# Patient Record
Sex: Male | Born: 1970 | Race: Black or African American | Hispanic: No | Marital: Single | Smoking: Current every day smoker
Health system: Southern US, Community
[De-identification: ages and names within clinical notes are randomized; demographics above are authoritative.]

## PROBLEM LIST (undated history)

## (undated) DIAGNOSIS — M549 Dorsalgia, unspecified: Secondary | ICD-10-CM

## (undated) DIAGNOSIS — K219 Gastro-esophageal reflux disease without esophagitis: Secondary | ICD-10-CM

## (undated) DIAGNOSIS — F32A Depression, unspecified: Secondary | ICD-10-CM

## (undated) DIAGNOSIS — F1121 Opioid dependence, in remission: Secondary | ICD-10-CM

## (undated) DIAGNOSIS — F1120 Opioid dependence, uncomplicated: Secondary | ICD-10-CM

## (undated) DIAGNOSIS — A4902 Methicillin resistant Staphylococcus aureus infection, unspecified site: Secondary | ICD-10-CM

## (undated) DIAGNOSIS — F329 Major depressive disorder, single episode, unspecified: Secondary | ICD-10-CM

## (undated) DIAGNOSIS — T7840XA Allergy, unspecified, initial encounter: Secondary | ICD-10-CM

## (undated) HISTORY — DX: Allergy, unspecified, initial encounter: T78.40XA

## (undated) HISTORY — DX: Opioid dependence, in remission: F11.21

## (undated) HISTORY — PX: KNEE ARTHROSCOPY: SUR90

## (undated) HISTORY — DX: Opioid dependence, uncomplicated: F11.20

---

## 2014-06-08 ENCOUNTER — Emergency Department (HOSPITAL_COMMUNITY): Payer: 59

## 2014-06-08 ENCOUNTER — Encounter (HOSPITAL_COMMUNITY): Payer: Self-pay | Admitting: Emergency Medicine

## 2014-06-08 ENCOUNTER — Emergency Department (HOSPITAL_COMMUNITY)
Admission: EM | Admit: 2014-06-08 | Discharge: 2014-06-09 | Disposition: A | Discharge status: Home / Self Care | Payer: 59 | Attending: Emergency Medicine | Admitting: Emergency Medicine

## 2014-06-08 DIAGNOSIS — Z79899 Other long term (current) drug therapy: Secondary | ICD-10-CM | POA: Diagnosis not present

## 2014-06-08 DIAGNOSIS — Z87828 Personal history of other (healed) physical injury and trauma: Secondary | ICD-10-CM | POA: Diagnosis not present

## 2014-06-08 DIAGNOSIS — Z720 Tobacco use: Secondary | ICD-10-CM | POA: Insufficient documentation

## 2014-06-08 DIAGNOSIS — Z8614 Personal history of Methicillin resistant Staphylococcus aureus infection: Secondary | ICD-10-CM | POA: Insufficient documentation

## 2014-06-08 DIAGNOSIS — R509 Fever, unspecified: Secondary | ICD-10-CM | POA: Diagnosis present

## 2014-06-08 DIAGNOSIS — R059 Cough, unspecified: Secondary | ICD-10-CM

## 2014-06-08 DIAGNOSIS — Z880 Allergy status to penicillin: Secondary | ICD-10-CM | POA: Insufficient documentation

## 2014-06-08 DIAGNOSIS — J180 Bronchopneumonia, unspecified organism: Secondary | ICD-10-CM | POA: Diagnosis not present

## 2014-06-08 DIAGNOSIS — R05 Cough: Secondary | ICD-10-CM

## 2014-06-08 HISTORY — DX: Methicillin resistant Staphylococcus aureus infection, unspecified site: A49.02

## 2014-06-08 HISTORY — DX: Dorsalgia, unspecified: M54.9

## 2014-06-08 LAB — CBC WITH DIFFERENTIAL/PLATELET
BASOS ABS: 0 10*3/uL (ref 0.0–0.1)
BASOS PCT: 0 % (ref 0–1)
EOS ABS: 0 10*3/uL (ref 0.0–0.7)
Eosinophils Relative: 0 % (ref 0–5)
HCT: 37 % — ABNORMAL LOW (ref 39.0–52.0)
HEMOGLOBIN: 12.3 g/dL — AB (ref 13.0–17.0)
LYMPHS PCT: 3 % — AB (ref 12–46)
Lymphs Abs: 0.4 10*3/uL — ABNORMAL LOW (ref 0.7–4.0)
MCH: 28.7 pg (ref 26.0–34.0)
MCHC: 33.2 g/dL (ref 30.0–36.0)
MCV: 86.4 fL (ref 78.0–100.0)
MONO ABS: 0.9 10*3/uL (ref 0.1–1.0)
MONOS PCT: 7 % (ref 3–12)
Neutro Abs: 11.8 10*3/uL — ABNORMAL HIGH (ref 1.7–7.7)
Neutrophils Relative %: 90 % — ABNORMAL HIGH (ref 43–77)
Platelets: 262 10*3/uL (ref 150–400)
RBC: 4.28 MIL/uL (ref 4.22–5.81)
RDW: 14.5 % (ref 11.5–15.5)
WBC: 13.2 10*3/uL — ABNORMAL HIGH (ref 4.0–10.5)

## 2014-06-08 LAB — COMPREHENSIVE METABOLIC PANEL
ALBUMIN: 4 g/dL (ref 3.5–5.2)
ALK PHOS: 47 U/L (ref 39–117)
ALT: 16 U/L (ref 0–53)
ANION GAP: 5 (ref 5–15)
AST: 18 U/L (ref 0–37)
BUN: 18 mg/dL (ref 6–23)
CO2: 25 mmol/L (ref 19–32)
Calcium: 8.6 mg/dL (ref 8.4–10.5)
Chloride: 106 mmol/L (ref 96–112)
Creatinine, Ser: 0.95 mg/dL (ref 0.50–1.35)
GFR calc non Af Amer: >90 mL/min (ref >90)
Glucose, Bld: 116 mg/dL — ABNORMAL HIGH (ref 70–99)
POTASSIUM: 2.8 mmol/L — AB (ref 3.5–5.1)
SODIUM: 136 mmol/L (ref 135–145)
Total Bilirubin: 0.6 mg/dL (ref 0.3–1.2)
Total Protein: 6.7 g/dL (ref 6.0–8.3)

## 2014-06-08 MED ORDER — SODIUM CHLORIDE 0.9 % IV BOLUS (SEPSIS)
1000.0000 mL | Freq: Once | INTRAVENOUS | Status: AC
  Administered 2014-06-08: 1000 mL via INTRAVENOUS

## 2014-06-08 MED ORDER — KETOROLAC TROMETHAMINE 30 MG/ML IJ SOLN
30.0000 mg | Freq: Once | INTRAMUSCULAR | Status: AC
  Administered 2014-06-08: 30 mg via INTRAVENOUS
  Filled 2014-06-08: qty 1

## 2014-06-08 NOTE — ED Notes (Addendum)
Patient states that he has had a cough that got worse today. Took temp tonight and it was 102 at home. Given nyquil. Alert and oriented. Also c/o headache and nausea

## 2014-06-08 NOTE — ED Provider Notes (Signed)
CSN: [id number]     Arrival date & time 06/08/14  2214 History   First MD Initiated Contact with Patient 06/08/14 2228     Chief Complaint  Patient presents with  . Fever  . URI   HPI  Patient is a 44 year old male with past medical history of back pain who presents emergency room for evaluation of fever and upper respiratory symptoms. Patient states that this morning he woke up and was feeling very hot. He developed a dry hacking cough with runny nose and congestion. He denies sore throat. He then developed nausea and vomiting. He took NyQuil at home but then proceeded to vomit. He is vomiting 3 times today. Patient reports that his brother had similar symptoms yesterday. Patient has not had a flu shot this year.  Past Medical History  Diagnosis Date  . Back pain   . MRSA (methicillin resistant Staphylococcus aureus)   . Back injury    History reviewed. No pertinent past surgical history. History reviewed. No pertinent family history. History  Substance Use Topics  . Smoking status: Current Every Day Smoker -- 1.00 packs/day  . Smokeless tobacco: Not on file  . Alcohol Use: Yes     Comment: fifth a week    Review of Systems  Constitutional: Positive for fever, chills and fatigue.  HENT: Positive for congestion and rhinorrhea. Negative for sinus pressure, sore throat and voice change.   Respiratory: Positive for cough, chest tightness and shortness of breath. Negative for wheezing.   Cardiovascular: Negative for chest pain, palpitations and leg swelling.  Gastrointestinal: Positive for nausea and vomiting. Negative for abdominal pain, diarrhea, constipation and blood in stool.  Genitourinary: Negative for dysuria, urgency, frequency, hematuria and difficulty urinating.  Neurological: Positive for headaches.      Allergies  Penicillins  Home Medications   Prior to Admission medications   Medication Sig Start Date End Date Taking? Authorizing Provider  Ascorbic Acid  (VITAMIN C ER PO) Take 1 tablet by mouth daily.   Yes Historical Provider, MD  azithromycin (ZITHROMAX) 250 MG tablet Take 1 tablet every day until finished. 06/09/14   Rashan Patient A Forcucci, PA-C  benzonatate (TESSALON) 100 MG capsule Take 1 capsule (100 mg total) by mouth every 8 (eight) hours. 06/09/14   Morrell Fluke A Forcucci, PA-C  calcium carbonate (TUMS - DOSED IN MG ELEMENTAL CALCIUM) 500 MG chewable tablet Chew 2 tablets by mouth 3 (three) times daily as needed for indigestion or heartburn.   Yes Historical Provider, MD  Cetirizine HCl (ZYRTEC ALLERGY) 10 MG CAPS Take 1 capsule (10 mg total) by mouth at bedtime. 06/09/14   Zarie Kosiba A Forcucci, PA-C  Cyanocobalamin (VITAMIN B-12 CR PO) Take 1 tablet by mouth daily.   Yes Historical Provider, MD  famotidine (PEPCID) 10 MG tablet Take 30 mg by mouth 2 (two) times daily as needed for heartburn or indigestion.   Yes Historical Provider, MD  Multiple Vitamin (MULTIVITAMIN WITH MINERALS) TABS tablet Take 1 tablet by mouth daily.   Yes Historical Provider, MD  omeprazole (PRILOSEC) 20 MG capsule Take 20 mg by mouth daily as needed (heart burn).   Yes Historical Provider, MD  ondansetron (ZOFRAN) 4 MG tablet Take 1 tablet (4 mg total) by mouth every 6 (six) hours. 06/09/14   Andrik Sandt A Forcucci, PA-C  POTASSIUM GLUCONATE PO Take 1 tablet by mouth daily.   Yes Historical Provider, MD  Pseudoeph-Doxylamine-DM-APAP 30-6.25-15-325 MG CAPS Take 2 capsules by mouth at bedtime as needed (cough).   Yes  Historical Provider, MD  Pyridoxine HCl (VITAMIN B-6 PO) Take 1 tablet by mouth daily.   Yes Historical Provider, MD  ranitidine (ZANTAC) 75 MG tablet Take 75 mg by mouth 2 (two) times daily as needed for heartburn.   Yes Historical Provider, MD  VITAMIN D, CHOLECALCIFEROL, PO Take 1 capsule by mouth daily.   Yes Historical Provider, MD   BP 138/76 mmHg  Pulse 94  Temp(Src) 98.3 F (36.8 C) (Oral)  Resp 20  Ht  (1.88 m)  Wt 230 lb (104.327 kg)  BMI 29.52  kg/m2  SpO2 94% Physical Exam  Constitutional: He is oriented to person, place, and time. He appears well-developed and well-nourished. No distress.  HENT:  Head: Normocephalic and atraumatic.  Mouth/Throat: Oropharynx is clear and moist. No oropharyngeal exudate.  Eyes: Conjunctivae and EOM are normal. Pupils are equal, round, and reactive to light. No scleral icterus.  Neck: Normal range of motion. Neck supple. No JVD present. No thyromegaly present.  Cardiovascular: Normal rate, regular rhythm, normal heart sounds and intact distal pulses.  Exam reveals no gallop and no friction rub.   No murmur heard. Pulmonary/Chest: Effort normal. No tachypnea. No respiratory distress. He has no decreased breath sounds. He has no wheezes. He has rhonchi in the right lower field and the left lower field. He has no rales. He exhibits no tenderness.  Abdominal: Soft. Bowel sounds are normal. He exhibits no distension and no mass. There is no tenderness. There is no rebound and no guarding.  Musculoskeletal: Normal range of motion.  Lymphadenopathy:    He has no cervical adenopathy.  Neurological: He is alert and oriented to person, place, and time.  Skin: Skin is warm and dry. He is not diaphoretic.  Psychiatric: He has a normal mood and affect. His behavior is normal. Judgment and thought content normal.  Nursing note and vitals reviewed.   ED Course  Procedures (including critical care time) Labs Review Labs Reviewed  CBC WITH DIFFERENTIAL/PLATELET - Abnormal; Notable for the following:    WBC 13.2 (*)    Hemoglobin 12.3 (*)    HCT 37.0 (*)    Neutrophils Relative % 90 (*)    Neutro Abs 11.8 (*)    Lymphocytes Relative 3 (*)    Lymphs Abs 0.4 (*)    All other components within normal limits  COMPREHENSIVE METABOLIC PANEL - Abnormal; Notable for the following:    Potassium 2.8 (*)    Glucose, Bld 116 (*)    All other components within normal limits    Imaging Review Dg Chest 2  View  06/08/2014   CLINICAL DATA:  Cough.  EXAM: CHEST  2 VIEW  COMPARISON:  None.  FINDINGS: There is diffuse interstitial coarsening from bronchial wall thickening with subtle asymmetric density at the medial right base. Normal heart size and aortic contours. Symmetric overgrowth of the first costochondral junction, without convincing superimposed nodule.  IMPRESSION: Bronchitis with possible bronchopneumonia in the right lower lobe. Recommend chest x-ray follow-up 6 weeks after treatment.   Electronically Signed   By: Marnee Spring M.D.   On: 06/08/2014 23:51     EKG Interpretation None      MDM   Final diagnoses:  Cough  Acute bronchopneumonia   Patient is a 44 year old male who presents emergency room for evaluation of fever, cough, and shortness of breath. On physical exam patient is ill-appearing but nontoxic. Patient was afebrile here until midway through his visit. Patient given Tylenol and Toradol here.  Patient also given 1 L normal saline bolus as he appeared to be mildly dehydrated. CMP reveals hypokalemia. CBC reveals mild leukocytosis. Chest x-ray reveals acute bronchitis versus bronchopneumonia. Patient is able to ambulate with an average pulse ox greater than 92%. We'll discharge home with azithromycin. Dose given here. We'll also give albuterol, Tessalon, Zyrtec, nasal saline, and Zofran. Patient given potassium here. Patient stable for discharge at this time. Patient states understanding and agreement with the above plan. Patient return for worsening shortness of breath, intractable fevers, chest pain, or any other concerning symptoms. He states understanding and agreement at this time. Resource list given. We'll also give content community health and wellness referral. Patient seen by and discussed with Dr. Adriana Simas who agrees the above workup and plan.    Eben Burow, PA-C 06/09/14 4401  Donnetta Hutching, MD 06/11/14 [phone]

## 2014-06-09 MED ORDER — ONDANSETRON HCL 4 MG PO TABS: 4.0000 mg | ORAL_TABLET | Freq: Four times a day (QID) | ORAL | Status: DC

## 2014-06-09 MED ORDER — ACETAMINOPHEN 325 MG PO TABS
650.0000 mg | ORAL_TABLET | Freq: Once | ORAL | Status: AC
  Administered 2014-06-09: 650 mg via ORAL
  Filled 2014-06-09: qty 2

## 2014-06-09 MED ORDER — CETIRIZINE HCL 10 MG PO CAPS: 10.0000 mg | ORAL_CAPSULE | Freq: Every day | ORAL | Status: DC

## 2014-06-09 MED ORDER — BENZONATATE 100 MG PO CAPS: 100.0000 mg | ORAL_CAPSULE | Freq: Three times a day (TID) | ORAL | Status: DC

## 2014-06-09 MED ORDER — POTASSIUM CHLORIDE CRYS ER 20 MEQ PO TBCR
40.0000 meq | EXTENDED_RELEASE_TABLET | Freq: Once | ORAL | Status: AC
  Administered 2014-06-09: 40 meq via ORAL
  Filled 2014-06-09: qty 2

## 2014-06-09 MED ORDER — AZITHROMYCIN 250 MG PO TABS: ORAL_TABLET | ORAL | Status: DC

## 2014-06-09 MED ORDER — AZITHROMYCIN 250 MG PO TABS
500.0000 mg | ORAL_TABLET | Freq: Once | ORAL | Status: AC
Start: 2014-06-09 — End: 2014-06-09
  Administered 2014-06-09: 500 mg via ORAL
  Filled 2014-06-09: qty 2

## 2014-06-09 MED ORDER — ALBUTEROL SULFATE HFA 108 (90 BASE) MCG/ACT IN AERS
2.0000 | INHALATION_SPRAY | Freq: Once | RESPIRATORY_TRACT | Status: AC
  Administered 2014-06-09: 2 via RESPIRATORY_TRACT
  Filled 2014-06-09: qty 13.4

## 2014-06-09 NOTE — ED Notes (Signed)
Stayed above 92% while ambulating

## 2014-06-09 NOTE — Discharge Instructions (Signed)

## 2014-08-04 ENCOUNTER — Encounter (HOSPITAL_COMMUNITY): Payer: Self-pay | Admitting: Emergency Medicine

## 2014-08-04 DIAGNOSIS — Z880 Allergy status to penicillin: Secondary | ICD-10-CM | POA: Insufficient documentation

## 2014-08-04 DIAGNOSIS — Z87828 Personal history of other (healed) physical injury and trauma: Secondary | ICD-10-CM | POA: Insufficient documentation

## 2014-08-04 DIAGNOSIS — Z8614 Personal history of Methicillin resistant Staphylococcus aureus infection: Secondary | ICD-10-CM | POA: Insufficient documentation

## 2014-08-04 DIAGNOSIS — E119 Type 2 diabetes mellitus without complications: Secondary | ICD-10-CM | POA: Diagnosis not present

## 2014-08-04 DIAGNOSIS — F1310 Sedative, hypnotic or anxiolytic abuse, uncomplicated: Secondary | ICD-10-CM | POA: Insufficient documentation

## 2014-08-04 DIAGNOSIS — Z79899 Other long term (current) drug therapy: Secondary | ICD-10-CM | POA: Diagnosis not present

## 2014-08-04 DIAGNOSIS — K219 Gastro-esophageal reflux disease without esophagitis: Secondary | ICD-10-CM | POA: Diagnosis not present

## 2014-08-04 DIAGNOSIS — F1110 Opioid abuse, uncomplicated: Secondary | ICD-10-CM | POA: Diagnosis not present

## 2014-08-04 DIAGNOSIS — R45851 Suicidal ideations: Secondary | ICD-10-CM | POA: Diagnosis present

## 2014-08-04 DIAGNOSIS — Z720 Tobacco use: Secondary | ICD-10-CM | POA: Insufficient documentation

## 2014-08-05 ENCOUNTER — Emergency Department (HOSPITAL_COMMUNITY)
Admission: EM | Admit: 2014-08-05 | Discharge: 2014-08-05 | Disposition: A | Discharge status: Home / Self Care | Payer: 59 | Attending: Emergency Medicine | Admitting: Emergency Medicine

## 2014-08-05 DIAGNOSIS — F1110 Opioid abuse, uncomplicated: Secondary | ICD-10-CM

## 2014-08-05 HISTORY — DX: Gastro-esophageal reflux disease without esophagitis: K21.9

## 2014-08-05 HISTORY — DX: Major depressive disorder, single episode, unspecified: F32.9

## 2014-08-05 HISTORY — DX: Depression, unspecified: F32.A

## 2014-08-05 LAB — COMPREHENSIVE METABOLIC PANEL
ALBUMIN: 4 g/dL (ref 3.5–5.2)
ALT: 22 U/L (ref 0–53)
AST: 46 U/L — ABNORMAL HIGH (ref 0–37)
Alkaline Phosphatase: 54 U/L (ref 39–117)
Anion gap: 8 (ref 5–15)
BUN: 11 mg/dL (ref 6–23)
CO2: 27 mmol/L (ref 19–32)
CREATININE: 1.25 mg/dL (ref 0.50–1.35)
Calcium: 9.4 mg/dL (ref 8.4–10.5)
Chloride: 103 mmol/L (ref 96–112)
GFR calc Af Amer: 79 mL/min — ABNORMAL LOW (ref >90)
GFR, EST NON AFRICAN AMERICAN: 69 mL/min — AB (ref >90)
GLUCOSE: 106 mg/dL — AB (ref 70–99)
Potassium: 3.3 mmol/L — ABNORMAL LOW (ref 3.5–5.1)
Sodium: 138 mmol/L (ref 135–145)
Total Bilirubin: 0.7 mg/dL (ref 0.3–1.2)
Total Protein: 6.9 g/dL (ref 6.0–8.3)

## 2014-08-05 LAB — CBC WITH DIFFERENTIAL/PLATELET
BASOS PCT: 0 % (ref 0–1)
Basophils Absolute: 0 10*3/uL (ref 0.0–0.1)
EOS ABS: 0.3 10*3/uL (ref 0.0–0.7)
EOS PCT: 3 % (ref 0–5)
HEMATOCRIT: 36.5 % — AB (ref 39.0–52.0)
Hemoglobin: 12.5 g/dL — ABNORMAL LOW (ref 13.0–17.0)
LYMPHS ABS: 2.8 10*3/uL (ref 0.7–4.0)
LYMPHS PCT: 32 % (ref 12–46)
MCH: 29.3 pg (ref 26.0–34.0)
MCHC: 34.2 g/dL (ref 30.0–36.0)
MCV: 85.5 fL (ref 78.0–100.0)
Monocytes Absolute: 0.6 10*3/uL (ref 0.1–1.0)
Monocytes Relative: 7 % (ref 3–12)
NEUTROS PCT: 58 % (ref 43–77)
Neutro Abs: 4.9 10*3/uL (ref 1.7–7.7)
Platelets: 315 10*3/uL (ref 150–400)
RBC: 4.27 MIL/uL (ref 4.22–5.81)
RDW: 14.8 % (ref 11.5–15.5)
WBC: 8.6 10*3/uL (ref 4.0–10.5)

## 2014-08-05 LAB — RAPID URINE DRUG SCREEN, HOSP PERFORMED
Amphetamines: NOT DETECTED
BARBITURATES: NOT DETECTED
BENZODIAZEPINES: POSITIVE — AB
COCAINE: NOT DETECTED
Opiates: POSITIVE — AB
TETRAHYDROCANNABINOL: NOT DETECTED

## 2014-08-05 LAB — ETHANOL

## 2014-08-05 MED ORDER — ONDANSETRON 8 MG PO TBDP: 8.0000 mg | ORAL_TABLET | Freq: Three times a day (TID) | ORAL | Status: DC | PRN

## 2014-08-05 MED ORDER — ZOLPIDEM TARTRATE 5 MG PO TABS: 5.0000 mg | ORAL_TABLET | Freq: Every evening | ORAL | Status: DC | PRN

## 2014-08-05 MED ORDER — CLONIDINE HCL 0.1 MG PO TABS: 0.1000 mg | ORAL_TABLET | Freq: Four times a day (QID) | ORAL | Status: DC | PRN

## 2014-08-05 NOTE — ED Notes (Signed)
Security wanded pt. at triage . Sitter arrived , sitting with pt. in triage rm. 4.

## 2014-08-05 NOTE — ED Notes (Signed)
Staffing office notified for pt.'s sitter . 

## 2014-08-05 NOTE — ED Notes (Signed)
Pt. reports feeling depressed with suicidal ideation but did not disclose plan of suicide , also requesting detox for Heroin addiction , last Heroin today denies hallucinations .

## 2014-08-05 NOTE — Discharge Instructions (Signed)
Opioid Withdrawal °Opioids are a group of narcotic drugs. They include the street drug heroin. They also include pain medicines, such as morphine, hydrocodone, oxycodone, and fentanyl. Opioid withdrawal is a group of characteristic physical and mental signs and symptoms. It typically occurs if you have been using opioids daily for several weeks or longer and stop using or rapidly decrease use. Opioid withdrawal can also occur if you have used opioids daily for a long time and are given a medicine to block the effect.  °SIGNS AND SYMPTOMS °Opioid withdrawal includes three or more of the following symptoms:  °· Depressed, anxious, or irritable mood. °· Nausea or vomiting. °· Muscle aches or spasms.   °· Watery eyes.    °· Runny nose. °· Dilated pupils, sweating, or hairs standing on end. °· Diarrhea or intestinal cramping. °· Yawning.   °· Fever. °· Increased blood pressure. °· Fast pulse. °· Restlessness or trouble sleeping. °These signs and symptoms occur within several hours of stopping or reducing short-acting opioids, such as heroin. They can occur within 3 days of stopping or reducing long-acting opioids, such as methadone. Withdrawal begins within minutes of receiving a drug that blocks the effects of opioids, such as naltrexone or naloxone. °DIAGNOSIS  °Opioid use disorder is diagnosed by your health care provider. You will be asked about your symptoms, drug and alcohol use, medical history, and use of medicines. A physical exam may be done. Lab tests may be ordered. Your health care provider may have you see a mental health professional.  °TREATMENT  °The treatment for opioid withdrawal is usually provided by medical doctors with special training in substance use disorders (addiction specialists). The following medicines may be included in treatment: °· Opioids given in place of the abused opioid. They turn on opioid receptors in the brain and lessen or prevent withdrawal symptoms. They are gradually  decreased (opioid substitution and taper). °· Non-opioids that can lessen certain opioid withdrawal symptoms. They may be used alone or with opioid substitution and taper. °Successful long-term recovery usually requires medicine, counseling, and group support. °HOME CARE INSTRUCTIONS  °· Take medicines only as directed by your health care provider. °· Check with your health care provider before starting new medicines. °· Keep all follow-up visits as directed by your health care provider. °SEEK MEDICAL CARE IF: °· You are not able to take your medicines as directed. °· Your symptoms get worse. °· You relapse. °SEEK IMMEDIATE MEDICAL CARE IF: °· You have serious thoughts about hurting yourself or others. °· You have a seizure. °· You lose consciousness. °Document Released: 03/31/2003 Document Revised: 08/12/2013 Document Reviewed: 04/10/2013 °ExitCare® Patient Information ©2015 [employer]. This information is not intended to replace advice given to you by your health care provider. Make sure you discuss any questions you have with your health care provider. ° °Emergency Department Resource Guide °1) Find a Doctor and Pay Out of Pocket °Although you won't have to find out who is covered by your insurance plan, it is a good idea to ask around and get recommendations. You will then need to call the office and see if the doctor you have chosen will accept you as a new patient and what types of options they offer for patients who are self-pay. Some doctors offer discounts or will set up payment plans for their patients who do not have insurance, but you will need to ask so you aren't surprised when you get to your appointment. ° °2) Contact Your Local Health Department °Not all health departments have   doctors that can see patients for sick visits, but many do, so it is worth a call to see if yours does. If you don't know where your local health department is, you can check in your phone book. The CDC also has a tool to  help you locate your state's health department, and many state websites also have listings of all of their local health departments. ° °3) Find a Walk-in Clinic °If your illness is not likely to be very severe or complicated, you may want to try a walk in clinic. These are popping up all over the country in pharmacies, drugstores, and shopping centers. They're usually staffed by nurse practitioners or physician assistants that have been trained to treat common illnesses and complaints. They're usually fairly quick and inexpensive. However, if you have serious medical issues or chronic medical problems, these are probably not your best option. ° °No Primary Care Doctor: °- Call [facility] at  832-8000 - they can help you locate a primary care doctor that  accepts your insurance, provides certain services, etc. °- Physician Referral Service- 1-800-533-3463 ° °Chronic Pain Problems: °Organization         Address  Phone   Notes  °[facility]  (336) 297-2271 Patients need to be referred by their primary care doctor.  ° °Medication Assistance: °Organization         Address  Phone   Notes  °[place] [facility] [address] °[place], [place] [address] (336) 641-8030 --Must be a resident of [place] °-- Must have NO insurance coverage whatsoever (no Medicaid/ Medicare, etc.) °-- The pt. MUST have a primary care doctor that directs their care regularly and follows them in the community °  °[facility]  (866) 331-1348   °[employer]  (888) 892-1162   ° °Agencies that provide inexpensive medical care: °Organization         Address  Phone   Notes  °[facility]  (336) 832-8035   °[facility]    (336) 832-7272   °[facility] [address] °[place], [place] [address] (336) 832-4777   °[facility] [address], °[place] (336) 271-4999   °[facility]    (336) 373-0678   °[facility]    (336) 272-1050   °[facility] ° [address], [place] Phone:  (336) 832-4444, Fax:  (336) 832-4440 Hours of Operation:  9 am - 6 pm, M-F.  Also accepts Medicaid/Medicare and self-pay.  °[facility] ° [address], [place] Phone: (336) 832-3150, Fax: (336) 832-3151. Hours of Operation:  8:30 am - 5:30 pm, M-F.  Also accepts Medicaid and self-pay.  °[facility] [place] [address], [place] Phone: (336) 878-6027   °[facility] [address], [place], [place] (336)723-1848, Ext. 123 Mondays & Thursdays: 7-9 AM.  First 15 patients are seen on a first come, first serve basis. °  ° °Medicaid-accepting [place] Providers: ° °Organization         Address  Phone   Notes  °[facility] [address], [place] (336) 641-2100 Also accepts self-pay patients.  °[facility] [address], [place] ° (336) 856-9996   °[facility] [address], [place] (336) 288-8857   °[facility] [address]   [place] [address], [place] (336) 299-7000   °Veita Bland [address], [place]  ° (336) 373-1557 Only accepts [place] Access Medicaid patients after they have their name applied to their card.  ° °Self-Pay (no insurance) in [place]: ° °Organization         Address  Phone   Notes  °Sickle Cell Patients, [facility] [address], [place] (336) 832-1970   °[facility] [address], [place] (336) 832-4400   °[facility] [place] ° [address], [place] (336) 992-4800   °[facility]/Dr. Osei-Bonsu ° [address] [place] [address], [place] or [address], [place] (336) [phone] Phone number for both [place] and [place] locations is the same.  °[facility] [address],  [place] (336) 299-0000   °[facility] [address] [place] [address], [place] or [address] (336) 852-7530 °(336) 878-2260   °[facility] [address], [place] (336) 350-1642, phone; (336) 294-5005, fax Sees patients 1st and 3rd Saturday of every month.  Must not qualify for public or private insurance (i.e. Medicaid, Medicare, [place] Health Choice, Veterans' Benefits) • Household income should be no more than 200% of the poverty level •The clinic cannot treat you if you are pregnant or think you are pregnant • Sexually transmitted diseases are not treated at the clinic.  ° ° °Dental Care: °Organization         Address  Phone  Notes  °[facility] [facility] [address], [place] (336) 641-6152 Accepts children up to age 21 who are enrolled in Medicaid or [place] Health Choice; pregnant women with a Medicaid card; and children who have applied for Medicaid or [place] Health Choice, but were declined, whose parents can pay a reduced fee at time of service.  °[facility] [place]  [address], [place] (336) 641-7733 Accepts children up to age 21 who are enrolled in Medicaid or [place] Health Choice; pregnant women with a Medicaid card; and children who have applied for Medicaid or [place] Health Choice, but were declined, whose parents can pay a reduced fee at time of service.  °[facility] ° [address], [place] (336) 641-4533 Patients are seen by appointment only. Walk-ins are not accepted. [facility] will see patients 18 years of age and older. °Monday - Tuesday (8am-5pm) °Most Wednesdays (8:30-5pm) °$30 per visit, cash only  °[facility] ° [address], [place] (336) 641-4533 Patients are seen by appointment only. Walk-ins are not accepted. [facility] will see patients 18 years of age and older. °One Wednesday Evening  (Monthly: Volunteer Based).  $30 per visit, cash only  °[facility]  (919) 537-3737 for adults; Children under age 4, call Graduate Pediatric Dentistry at (919) [phone]. Children aged 4-14, please call (919) [phone] to request a pediatric application. ° Dental services are provided in all areas of dental care including fillings, crowns and bridges, complete and partial dentures, implants, gum treatment, root canals, and extractions. Preventive care is also provided. Treatment is provided to both adults and children. °Patients are selected via a lottery and there is often a waiting list. °  °[facility] [address], °[place] ° (336) 763-8833 [email or web address] °  °[facility] [address], [place], [place] (336)723-1848, Ext. 123 Second and Fourth Thursday of each month, opens at 6:30 AM; Clinic ends at 9 AM.  Patients are seen on a first-come first-served basis, and a limited number are seen during each clinic.  ° °[facility] ° [address], [place], [place] (336) 723-7904   Eligibility Requirements °You must have lived in [place], [place], or [place] counties for at least the last three months. °  You cannot be eligible for state or federal sponsored healthcare insurance, including Veterans Administration, Medicaid, or Medicare. °  You generally cannot be eligible for healthcare insurance through your employer.  °  How to apply: °Eligibility screenings are held every Tuesday and Wednesday afternoon from 1:00 pm until 4:00 pm. You do not need an appointment for the interview!  °[facility] [address], [place], [place] 336-631-2330   °[facility]  336-342-8273   °[facility]  336-703-3100   °[facility]  336-570-6415   ° °Behavioral Health Resources in the Community: °Intensive Outpatient Programs °Organization         Address  Phone  Notes  °[place]  [facility] [address], [place], [place] 336-878-6098   °[facility] [address], [place], [place] 336-832-9800   °[facility] [address], [place], [place] ° 336-882-2125   °[facility] [address],  °[place], [place] 1-800-853-5163 or 336-641-4981   °Substance Abuse Resources °Organization         Address  Phone  Notes  °[facility]  336-882-2125   °[facility]  336-784-9470   °[facility]  336-285-9073   °[facility]  336-845-3988   °Residential & Outpatient Substance Abuse Program  1-800-659-3381   °Psychological Services °Organization         Address  Phone  Notes  °[facility]  336- 832-9600   °[facility]  336- 378-7881   °[facility] [address], [place] 1-800-853-5163 or 336-641-4981   ° °Mobile Crisis Teams °Organization         Address  Phone  Notes  °[facility], Mobile Crisis Care Unit  1-877-626-1772   °Assertive °[facility] ° [address] [place], [place] 336-834-9664   °Sharon DeEsch [address] °[place] 336-554-5454   ° °Self-Help/Support Groups °Organization         Address  Phone             Notes  °[facility] - variety of support groups  336- 373-1402 Call for more information  °Narcotics Anonymous (NA), [facility] [address], °[place]  2 meetings at this location  ° °Residential Treatment Programs °Organization         Address  Phone  Notes  °[facility] [address],    °[place]  1-866-801-8205   °[facility] ° [address], [place], [place] 704-293-8524   °[facility] [address], [place] 336-845-3988 Admissions: 8am-3pm M-F  °[facility] [address]    °[place], [place] 336-841-1104   °[facility] [address],  [place],  336-379-7146   °[facility] [address]  °[place],  336-285-9073   °[facility] - Intensive Outpatient [address], [place],   [place] 336-852-3033   °[facility] [address]  °[place], [place] 1-877-615-2722 or 336-784-9470   °[facility] [address] [place], [place] 336-227-7417 Accepts Medicaid  °[facility] [address]  °[place] 1-800-659-3381 Substance Abuse/Addiction Treatment  ° °[place] Behavioral Health Resources °Organization         Address  Phone  Notes  °[facility]  (888) 581-9988   °Julie Brannon, PhD [address] [place], [place]   (336) 349-5553 or (336) 951-0000   °[facility]   [address] °[place], [place] (336) 349-4454   °[facility] [address], [place], [place] (336) 342-8316 Insurance/Medicaid/sponsorship through [facility]  °[facility] [address]                                    [place], [place] (336) 342-8316 Therapy/tele-psych/case  °[facility] [address]  ° [place], [place] (336) 349-2233    °Dr. Arfeen  (336) 349-4544   °[facility] [place]  [employer] [place] [facility] 1) [address],  °2) [address], [place] °3)  [address], [place] (336) 349-3220 °(336) 342-7768 ° °(336) 342-8140   °[place] [facility] (336) 342-1394 or (336) 342-3537 (After Hours)    ° ° °

## 2014-08-05 NOTE — ED Notes (Signed)
Wife called to pick patient up for discharge.

## 2014-08-05 NOTE — ED Provider Notes (Signed)
CSN: [id number]     Arrival date & time 08/04/14  2343 History  This chart was scribed for Azalia Bilis, MD by Annye Asa, ED Scribe. This patient was seen in room C21C/C21C and the patient's care was started at 1:45 AM.    Chief Complaint  Patient presents with  . Suicidal  . Addiction Problem   The history is provided by the patient. No language interpreter was used.     HPI Comments: Grant Woods is a 44 y.o. male who presents to the Emergency Department for addiction problem and SI. Patient reports he has been using heroin for the last 10 years; last use today. He also reports vague, passive suicidal ideation after recently losing his mother but denies a specific plan; when specifically asked if he had any intention of killing himself, patient vehemently denied, stating "No." denies vomiting. No abdominal pain.   Past Medical History  Diagnosis Date  . Back pain   . MRSA (methicillin resistant Staphylococcus aureus)   . Back injury   . Heroin abuse   . Depression   . GERD (gastroesophageal reflux disease)   . Diabetes mellitus without complication    History reviewed. No pertinent past surgical history. No family history on file. History  Substance Use Topics  . Smoking status: Current Every Day Smoker -- 1.00 packs/day  . Smokeless tobacco: Not on file  . Alcohol Use: Yes     Comment: fifth a week    Review of Systems  A complete 10 system review of systems was obtained and all systems are negative except as noted in the HPI and PMH.    Allergies  Penicillins  Home Medications   Prior to Admission medications   Medication Sig Start Date End Date Taking? Authorizing Provider  Ascorbic Acid (VITAMIN C ER PO) Take 1 tablet by mouth daily.    Historical Provider, MD  azithromycin (ZITHROMAX) 250 MG tablet Take 1 tablet every day until finished. 06/09/14   Courtney Forcucci, PA-C  benzonatate (TESSALON) 100 MG capsule Take 1 capsule (100 mg total) by mouth every 8  (eight) hours. 06/09/14   Courtney Forcucci, PA-C  calcium carbonate (TUMS - DOSED IN MG ELEMENTAL CALCIUM) 500 MG chewable tablet Chew 2 tablets by mouth 3 (three) times daily as needed for indigestion or heartburn.    Historical Provider, MD  Cetirizine HCl (ZYRTEC ALLERGY) 10 MG CAPS Take 1 capsule (10 mg total) by mouth at bedtime. 06/09/14   Courtney Forcucci, PA-C  Cyanocobalamin (VITAMIN B-12 CR PO) Take 1 tablet by mouth daily.    Historical Provider, MD  famotidine (PEPCID) 10 MG tablet Take 30 mg by mouth 2 (two) times daily as needed for heartburn or indigestion.    Historical Provider, MD  Multiple Vitamin (MULTIVITAMIN WITH MINERALS) TABS tablet Take 1 tablet by mouth daily.    Historical Provider, MD  omeprazole (PRILOSEC) 20 MG capsule Take 20 mg by mouth daily as needed (heart burn).    Historical Provider, MD  ondansetron (ZOFRAN) 4 MG tablet Take 1 tablet (4 mg total) by mouth every 6 (six) hours. 06/09/14   Courtney Forcucci, PA-C  POTASSIUM GLUCONATE PO Take 1 tablet by mouth daily.    Historical Provider, MD  Pseudoeph-Doxylamine-DM-APAP 30-6.25-15-325 MG CAPS Take 2 capsules by mouth at bedtime as needed (cough).    Historical Provider, MD  Pyridoxine HCl (VITAMIN B-6 PO) Take 1 tablet by mouth daily.    Historical Provider, MD  ranitidine (ZANTAC) 75 MG tablet  Take 75 mg by mouth 2 (two) times daily as needed for heartburn.    Historical Provider, MD  VITAMIN D, CHOLECALCIFEROL, PO Take 1 capsule by mouth daily.    Historical Provider, MD   BP 141/94 mmHg  Pulse 78  Temp(Src) 97.9 F (36.6 C) (Oral)  Resp 14  Ht 6\' 2"  (1.88 m)  Wt 226 lb (102.513 kg)  BMI 29.00 kg/m2  SpO2 97% Physical Exam  Constitutional: He is oriented to person, place, and time. He appears well-developed and well-nourished.  HENT:  Head: Normocephalic.  Eyes: EOM are normal.  Neck: Normal range of motion.  Pulmonary/Chest: Effort normal.  Abdominal: He exhibits no distension.  Musculoskeletal:  Normal range of motion.  Neurological: He is alert and oriented to person, place, and time.  Psychiatric: He has a normal mood and affect.  Nursing note and vitals reviewed.   ED Course  Procedures   DIAGNOSTIC STUDIES: Oxygen Saturation is 97% on RA, adequate by my interpretation.    COORDINATION OF CARE: 1:47 AM Discussed treatment plan with pt at bedside and pt agreed to plan.   Labs Review Labs Reviewed  URINE RAPID DRUG SCREEN (HOSP PERFORMED) - Abnormal; Notable for the following:    Opiates POSITIVE (*)    Benzodiazepines POSITIVE (*)    All other components within normal limits  CBC WITH DIFFERENTIAL/PLATELET - Abnormal; Notable for the following:    Hemoglobin 12.5 (*)    HCT 36.5 (*)    All other components within normal limits  COMPREHENSIVE METABOLIC PANEL - Abnormal; Notable for the following:    Potassium 3.3 (*)    Glucose, Bld 106 (*)    AST 46 (*)    GFR calc non Af Amer 69 (*)    GFR calc Af Amer 79 (*)    All other components within normal limits  ETHANOL    Imaging Review No results found.   EKG Interpretation None      MDM   Final diagnoses:  Heroin abuse    The patient presents today with narcotic abuse.  There is no indication for involuntary commitment for inpatient treatment.  I think the patient is best managed as an outpatient for his/her opioid abuse.  The patient will be discharged home with a prescription for clonidine, Ambien, and antiemitics.    I personally performed the services described in this documentation, which was scribed in my presence. The recorded information has been reviewed and is accurate.        Azalia BilisKevin Waldo Damian, MD 08/05/14 [phone]

## 2015-01-15 ENCOUNTER — Ambulatory Visit
Admission: RE | Admit: 2015-01-15 | Discharge: 2015-01-15 | Disposition: A | Discharge status: Home / Self Care | Payer: 59 | Source: Ambulatory Visit | Attending: Family Medicine | Admitting: Family Medicine

## 2015-01-15 ENCOUNTER — Other Ambulatory Visit: Payer: Self-pay | Admitting: Family Medicine

## 2015-01-15 DIAGNOSIS — M545 Low back pain: Secondary | ICD-10-CM

## 2015-03-20 ENCOUNTER — Inpatient Hospital Stay (HOSPITAL_COMMUNITY)
Admission: EM | Admit: 2015-03-20 | Discharge: 2015-03-23 | DRG: 158 | Disposition: A | Discharge status: Home / Self Care | Payer: 59 | Attending: Internal Medicine | Admitting: Internal Medicine

## 2015-03-20 ENCOUNTER — Encounter (HOSPITAL_COMMUNITY): Payer: Self-pay | Admitting: Emergency Medicine

## 2015-03-20 ENCOUNTER — Emergency Department (HOSPITAL_COMMUNITY): Payer: 59

## 2015-03-20 DIAGNOSIS — Z8249 Family history of ischemic heart disease and other diseases of the circulatory system: Secondary | ICD-10-CM

## 2015-03-20 DIAGNOSIS — Z823 Family history of stroke: Secondary | ICD-10-CM

## 2015-03-20 DIAGNOSIS — K219 Gastro-esophageal reflux disease without esophagitis: Secondary | ICD-10-CM | POA: Diagnosis present

## 2015-03-20 DIAGNOSIS — K047 Periapical abscess without sinus: Principal | ICD-10-CM | POA: Diagnosis present

## 2015-03-20 DIAGNOSIS — Z833 Family history of diabetes mellitus: Secondary | ICD-10-CM

## 2015-03-20 DIAGNOSIS — F329 Major depressive disorder, single episode, unspecified: Secondary | ICD-10-CM | POA: Diagnosis present

## 2015-03-20 DIAGNOSIS — F17200 Nicotine dependence, unspecified, uncomplicated: Secondary | ICD-10-CM | POA: Diagnosis present

## 2015-03-20 DIAGNOSIS — Z8614 Personal history of Methicillin resistant Staphylococcus aureus infection: Secondary | ICD-10-CM

## 2015-03-20 DIAGNOSIS — L03211 Cellulitis of face: Secondary | ICD-10-CM | POA: Diagnosis not present

## 2015-03-20 LAB — I-STAT CHEM 8, ED
BUN: 17 mg/dL (ref 6–20)
Calcium, Ion: 1.19 mmol/L (ref 1.12–1.23)
Chloride: 104 mmol/L (ref 101–111)
Creatinine, Ser: 0.7 mg/dL (ref 0.61–1.24)
Glucose, Bld: 102 mg/dL — ABNORMAL HIGH (ref 65–99)
HEMATOCRIT: 41 % (ref 39.0–52.0)
HEMOGLOBIN: 13.9 g/dL (ref 13.0–17.0)
POTASSIUM: 3.8 mmol/L (ref 3.5–5.1)
Sodium: 140 mmol/L (ref 135–145)
TCO2: 26 mmol/L (ref 0–100)

## 2015-03-20 MED ORDER — ACETAMINOPHEN 650 MG RE SUPP: 650.0000 mg | Freq: Four times a day (QID) | RECTAL | Status: DC | PRN

## 2015-03-20 MED ORDER — ONDANSETRON HCL 4 MG PO TABS: 4.0000 mg | ORAL_TABLET | Freq: Four times a day (QID) | ORAL | Status: DC | PRN

## 2015-03-20 MED ORDER — SODIUM CHLORIDE 0.9 % IV BOLUS (SEPSIS)
1000.0000 mL | Freq: Once | INTRAVENOUS | Status: AC
  Administered 2015-03-20: 1000 mL via INTRAVENOUS

## 2015-03-20 MED ORDER — ONDANSETRON HCL 4 MG/2ML IJ SOLN: 4.0000 mg | Freq: Four times a day (QID) | INTRAMUSCULAR | Status: DC | PRN

## 2015-03-20 MED ORDER — NAPROXEN SODIUM 550 MG PO TABS
550.0000 mg | ORAL_TABLET | Freq: Once | ORAL | Status: AC
  Administered 2015-03-20: 550 mg via ORAL
  Filled 2015-03-20: qty 1

## 2015-03-20 MED ORDER — CLINDAMYCIN PHOSPHATE 900 MG/50ML IV SOLN
900.0000 mg | Freq: Four times a day (QID) | INTRAVENOUS | Status: DC
  Administered 2015-03-21 – 2015-03-23 (×10): 900 mg via INTRAVENOUS
  Filled 2015-03-20 (×12): qty 50

## 2015-03-20 MED ORDER — ACETAMINOPHEN 325 MG PO TABS
650.0000 mg | ORAL_TABLET | Freq: Four times a day (QID) | ORAL | Status: DC | PRN
  Administered 2015-03-20 – 2015-03-22 (×4): 650 mg via ORAL
  Filled 2015-03-20 (×4): qty 2

## 2015-03-20 MED ORDER — SODIUM CHLORIDE 0.9 % IJ SOLN
3.0000 mL | Freq: Two times a day (BID) | INTRAMUSCULAR | Status: DC
  Administered 2015-03-20 – 2015-03-22 (×5): 3 mL via INTRAVENOUS

## 2015-03-20 MED ORDER — IBUPROFEN 200 MG PO TABS
600.0000 mg | ORAL_TABLET | Freq: Four times a day (QID) | ORAL | Status: DC | PRN
  Administered 2015-03-20 – 2015-03-21 (×2): 600 mg via ORAL
  Filled 2015-03-20 (×2): qty 3

## 2015-03-20 MED ORDER — CLINDAMYCIN PHOSPHATE 600 MG/50ML IV SOLN
600.0000 mg | Freq: Once | INTRAVENOUS | Status: AC
  Administered 2015-03-20: 600 mg via INTRAVENOUS
  Filled 2015-03-20: qty 50

## 2015-03-20 MED ORDER — IBUPROFEN 800 MG PO TABS
800.0000 mg | ORAL_TABLET | Freq: Once | ORAL | Status: AC
Start: 2015-03-20 — End: 2015-03-20
  Administered 2015-03-20: 800 mg via ORAL
  Filled 2015-03-20: qty 1

## 2015-03-20 MED ORDER — IOHEXOL 300 MG/ML  SOLN
75.0000 mL | Freq: Once | INTRAMUSCULAR | Status: AC | PRN
  Administered 2015-03-20: 75 mL via INTRAVENOUS

## 2015-03-20 NOTE — H&P (Signed)
PCP: Cecile Sheerer White?   Referring provider Kayla   Chief Complaint:  Tooth pain  HPI: Grant Woods is a 44 y.o. male   has a past medical history of Back pain; MRSA (methicillin resistant Staphylococcus aureus); Back injury; Heroin abuse; Depression; and GERD (gastroesophageal reflux disease).   Presented with  3 day of right facial swelling and pain. Patient has hx of tooth decay and pain for the past week. He presented to his PCP and was started on clindamycin but pain and swelling got worse. Deneis any fever. He does not have a dentist. CT showed facial cellulitis and right perimandibular abscess.   Hospitalist was called for admission for  Facial cellulitis.   Review of Systems:    Pertinent positives include:  Facial swelling and pain  Constitutional:  No weight loss, night sweats, Fevers, chills, fatigue, weight loss  HEENT:  No headaches, Difficulty swallowing,Tooth/dental problems,Sore throat,  No sneezing, itching, ear ache, nasal congestion, post nasal drip,  Cardio-vascular:  No chest pain, Orthopnea, PND, anasarca, dizziness, palpitations.no Bilateral lower extremity swelling  GI:  No heartburn, indigestion, abdominal pain, nausea, vomiting, diarrhea, change in bowel habits, loss of appetite, melena, blood in stool, hematemesis Resp:  no shortness of breath at rest. No dyspnea on exertion, No excess mucus, no productive cough, No non-productive cough, No coughing up of blood.No change in color of mucus.No wheezing. Skin:  no rash or lesions. No jaundice GU:  no dysuria, change in color of urine, no urgency or frequency. No straining to urinate.  No flank pain.  Musculoskeletal:  No joint pain or no joint swelling. No decreased range of motion. No back pain.  Psych:  No change in mood or affect. No depression or anxiety. No memory loss.  Neuro: no localizing neurological complaints, no tingling, no weakness, no double vision, no gait abnormality, no  slurred speech, no confusion  Otherwise ROS are negative except for above, 10 systems were reviewed  Past Medical History: Past Medical History  Diagnosis Date  . Back pain   . MRSA (methicillin resistant Staphylococcus aureus)   . Back injury   . Heroin abuse   . Depression   . GERD (gastroesophageal reflux disease)    History reviewed. No pertinent past surgical history.   Medications: Prior to Admission medications   Medication Sig Start Date End Date Taking? Authorizing Provider  Ascorbic Acid (VITAMIN C ER PO) Take 1 tablet by mouth daily.    Historical Provider, MD  benzonatate (TESSALON) 100 MG capsule Take 1 capsule (100 mg total) by mouth every 8 (eight) hours. 06/09/14   Courtney Forcucci, PA-C  calcium carbonate (TUMS - DOSED IN MG ELEMENTAL CALCIUM) 500 MG chewable tablet Chew 2 tablets by mouth 3 (three) times daily as needed for indigestion or heartburn.    Historical Provider, MD  Cetirizine HCl (ZYRTEC ALLERGY) 10 MG CAPS Take 1 capsule (10 mg total) by mouth at bedtime. 06/09/14   Courtney Forcucci, PA-C  cloNIDine (CATAPRES) 0.1 MG tablet Take 1 tablet (0.1 mg total) by mouth every 6 (six) hours as needed (withdrawl symptoms). 08/05/14   Azalia Bilis, MD  Cyanocobalamin (VITAMIN B-12 CR PO) Take 1 tablet by mouth daily.    Historical Provider, MD  famotidine (PEPCID) 10 MG tablet Take 30 mg by mouth 2 (two) times daily as needed for heartburn or indigestion.    Historical Provider, MD  Multiple Vitamin (MULTIVITAMIN WITH MINERALS) TABS tablet Take 1 tablet by mouth daily.  Historical Provider, MD  omeprazole (PRILOSEC) 20 MG capsule Take 20 mg by mouth daily as needed (heart burn).    Historical Provider, MD  ondansetron (ZOFRAN ODT) 8 MG disintegrating tablet Take 1 tablet (8 mg total) by mouth every 8 (eight) hours as needed for nausea or vomiting. 08/05/14   Azalia BilisKevin Campos, MD  ondansetron (ZOFRAN) 4 MG tablet Take 1 tablet (4 mg total) by mouth every 6 (six) hours.  06/09/14   Courtney Forcucci, PA-C  POTASSIUM GLUCONATE PO Take 1 tablet by mouth daily.    Historical Provider, MD  Pseudoeph-Doxylamine-DM-APAP 30-6.25-15-325 MG CAPS Take 2 capsules by mouth at bedtime as needed (cough).    Historical Provider, MD  Pyridoxine HCl (VITAMIN B-6 PO) Take 1 tablet by mouth daily.    Historical Provider, MD  ranitidine (ZANTAC) 75 MG tablet Take 75 mg by mouth 2 (two) times daily as needed for heartburn.    Historical Provider, MD  VITAMIN D, CHOLECALCIFEROL, PO Take 1 capsule by mouth daily.    Historical Provider, MD  zolpidem (AMBIEN) 5 MG tablet Take 1 tablet (5 mg total) by mouth at bedtime as needed for sleep. 08/05/14   Azalia BilisKevin Campos, MD    Allergies:   Allergies  Allergen Reactions  . Penicillins Hives    Social History:  Ambulatory   independently   Lives at home With family     reports that he has been smoking.  He does not have any smokeless tobacco history on file. He reports that he drinks alcohol. He reports that he uses illicit drugs.    Family History: family history includes Diabetes type II in his father and mother; Hypertension in his mother; Stroke in his mother.    Physical Exam: Patient Vitals for the past 24 hrs:  BP Temp Pulse Resp SpO2  03/20/15 1853 124/88 mmHg - 72 18 97 %  03/20/15 1628 135/80 mmHg 98.2 F (36.8 C) 85 18 100 %    1. General:  in No Acute distress 2. Psychological: Alert and  Oriented 3. Head/ENT:     Dry Mucous Membranes                          Head Non traumatic, neck supple                           Poor Dentition right facial swelling 4. SKIN: normal  Skin turgor,  Skin clean Dry and intact no rash 5. Heart: Regular rate and rhythm no Murmur, Rub or gallop 6. Lungs: Clear to auscultation bilaterally, no wheezes or crackles   7. Abdomen: Soft, non-tender, Non distended 8. Lower extremities: no clubbing, cyanosis, or edema 9. Neurologically Grossly intact, moving all 4 extremities equally 10.  MSK: Normal range of motion  body mass index is unknown because there is no weight on file.   Labs on Admission:   Results for orders placed or performed during the hospital encounter of 03/20/15 (from the past 24 hour(s))  I-Stat Chem 8, ED     Status: Abnormal   Collection Time: 03/20/15  5:15 PM  Result Value Ref Range   Sodium 140 135 - 145 mmol/L   Potassium 3.8 3.5 - 5.1 mmol/L   Chloride 104 101 - 111 mmol/L   BUN 17 6 - 20 mg/dL   Creatinine, Ser 7.820.70 0.61 - 1.24 mg/dL   Glucose, Bld 956102 (H) 65 - 99 mg/dL  Calcium, Ion 1.19 1.12 - 1.23 mmol/L   TCO2 26 0 - 100 mmol/L   Hemoglobin 13.9 13.0 - 17.0 g/dL   HCT 91.4 78.2 - 95.6 %    UA not obtained  No results found for: HGBA1C  CrCl cannot be calculated (Unknown ideal weight.).  BNP (last 3 results) No results for input(s): PROBNP in the last 8760 hours.  Other results:  I have pearsonaly reviewed this: ECG REPORT Not obtained  There were no vitals filed for this visit.   Cultures: No results found for: SDES, SPECREQUEST, CULT, REPTSTATUS   Radiological Exams on Admission: Ct Maxillofacial W/cm  03/20/2015  CLINICAL DATA:  Pain in right lower teeth.  Increased swelling. EXAM: CT MAXILLOFACIAL WITH CONTRAST TECHNIQUE: Multidetector CT imaging of the maxillofacial structures was performed with intravenous contrast. Multiplanar CT image reconstructions were also generated. A small metallic BB was placed on the right temple in order to reliably differentiate right from left. CONTRAST:  75mL OMNIPAQUE IOHEXOL 300 MG/ML  SOLN COMPARISON:  None. FINDINGS: Orbits unremarkable.  Parapharyngeal spaces normal. Small cavity along the distal margin of tooth #31, images 38-39 series 4. Cavity along the distal buccal margin of tooth #30, images 36-37 of series 4, with the cavity undercutting the filling on image 28 series 8. There is a small abscess tracking along the adjacent mandibular margin, measuring about 2.4 cm in length  and about 5 mm in depth, with small locules of gas as shown on images 34 through 27 of series 3. Adjacent right facial cellulitis adjacent to the mandible and tracking under the chin and potentially to a lesser degree around to the left side. Small reactive submental lymph nodes. Right submandibular lymph node measures 1 cm in short axis on image 26 series 3. Right submental lymph node 0.7 cm in short axis on image 13 series 3. Chronic right maxillary sinusitis. IMPRESSION: 1. Superficial right perimandibular abscess along the mandibular margin, likely associated with the cavities of teeth # 30 and # 31. Interestingly there is no appreciable periapical lucency or demineralization of the alveolar ridge. Regional right facial cellulitis. No other cavities. Electronically Signed   By: Gaylyn Rong M.D.   On: 03/20/2015 17:58    Chart has been reviewed  Family not  at  Bedside    Assessment/Plan  44 year old gentleman with past history of drug abuse currently in remission presents with facial pain and swelling was found to have. Dental abscess resulting in facial cellulitis being admitted for IV antibiotics  Present on Admission:  . Abscessed tooth. Dental abscess - given patient failed outpatient treatment was started with IV clindamycin with a best oral coverage will need maxillofacial consult in the morning or when available for abscess drainage and tooth extraction  . Facial cellulitis with abscess-  covered with antibiotics currently appears to be stable Stroke drug abuse. We will avoid narcotics if possible. Ibuprofen as needed for pain and swelling. Patient states that he takes some medication for his opioid addiction unsure of the name wife will bring prescription and tomorrow  Prophylaxis:  Lovenox   CODE STATUS:  FULL CODE  as per patient    Disposition:   To home once workup is complete and patient is stable  Other plan as per orders.  I have spent a total of 55 min on this  admission  Makailey Hodgkin 03/20/2015, 7:13 PM  [facility]  Pager [phone]   after 2 AM please page floor coverage PA If 7AM-7PM, please contact the  day team taking care of the patient  [email or web address]  Password [email or web address]

## 2015-03-20 NOTE — Progress Notes (Signed)
Patient educated on need to keep bed in lowest position for safety.  Patient chose to disregard education and elevate bed height at his preference.

## 2015-03-20 NOTE — ED Provider Notes (Signed)
CSN: [id number]     Arrival date & time 03/20/15  1612 History  By signing my name below, I, Murriel Hopper, attest that this documentation has been prepared under the direction and in the presence of Cheri Fowler, PA-C.  Electronically Signed: Murriel Hopper, ED Scribe. 03/20/2015. 4:40 PM.    Chief Complaint  Patient presents with  . Dental Pain     Patient is a 44 y.o. male presenting with tooth pain. The history is provided by the patient. No language interpreter was used.  Dental Pain  HPI Comments: Grant Woods is a 44 y.o. male who presents to the Emergency Department complaining of constant, worsening right mandibular dental pain with associated right-sided facial pain and swelling that has been present for 8 days. He also endorses mild right sided neck pain. Pt reports he had a filling that fell out 8 days ago, and since then, pt has had pain in the tooth and the right side of his lower jaw. Pt states that he saw his PCP three days ago and received Clindamycin to treat his symptoms, but pt reports his swelling and pain has worsened since taking the antibiotics. Pt states he has not seen a dentist because he has to pay for services out-of-pocket. Pt denies difficulty swallowing, drooling, fever, chills, N/V, neck  stiffness.   Past Medical History  Diagnosis Date  . Back pain   . MRSA (methicillin resistant Staphylococcus aureus)   . Back injury   . Heroin abuse   . Depression   . GERD (gastroesophageal reflux disease)    History reviewed. No pertinent past surgical history. Family History  Problem Relation Age of Onset  . Diabetes type II Mother   . Stroke Mother   . Hypertension Mother   . Diabetes type II Father    Social History  Substance Use Topics  . Smoking status: Current Every Day Smoker -- 1.00 packs/day  . Smokeless tobacco: None  . Alcohol Use: Yes     Comment: occasional    Review of Systems  A complete 10 system review of systems was obtained and all  systems are negative except as noted in the HPI and PMH.    Allergies  Penicillins  Home Medications   Prior to Admission medications   Medication Sig Start Date End Date Taking? Authorizing Provider  Ascorbic Acid (VITAMIN C ER PO) Take 1 tablet by mouth daily.    Historical Provider, MD  benzonatate (TESSALON) 100 MG capsule Take 1 capsule (100 mg total) by mouth every 8 (eight) hours. 06/09/14   Courtney Forcucci, PA-C  calcium carbonate (TUMS - DOSED IN MG ELEMENTAL CALCIUM) 500 MG chewable tablet Chew 2 tablets by mouth 3 (three) times daily as needed for indigestion or heartburn.    Historical Provider, MD  Cetirizine HCl (ZYRTEC ALLERGY) 10 MG CAPS Take 1 capsule (10 mg total) by mouth at bedtime. 06/09/14   Courtney Forcucci, PA-C  cloNIDine (CATAPRES) 0.1 MG tablet Take 1 tablet (0.1 mg total) by mouth every 6 (six) hours as needed (withdrawl symptoms). 08/05/14   Azalia Bilis, MD  Cyanocobalamin (VITAMIN B-12 CR PO) Take 1 tablet by mouth daily.    Historical Provider, MD  famotidine (PEPCID) 10 MG tablet Take 30 mg by mouth 2 (two) times daily as needed for heartburn or indigestion.    Historical Provider, MD  Multiple Vitamin (MULTIVITAMIN WITH MINERALS) TABS tablet Take 1 tablet by mouth daily.    Historical Provider, MD  omeprazole (PRILOSEC) 20  MG capsule Take 20 mg by mouth daily as needed (heart burn).    Historical Provider, MD  ondansetron (ZOFRAN ODT) 8 MG disintegrating tablet Take 1 tablet (8 mg total) by mouth every 8 (eight) hours as needed for nausea or vomiting. 08/05/14   Azalia BilisKevin Campos, MD  ondansetron (ZOFRAN) 4 MG tablet Take 1 tablet (4 mg total) by mouth every 6 (six) hours. 06/09/14   Courtney Forcucci, PA-C  POTASSIUM GLUCONATE PO Take 1 tablet by mouth daily.    Historical Provider, MD  Pseudoeph-Doxylamine-DM-APAP 30-6.25-15-325 MG CAPS Take 2 capsules by mouth at bedtime as needed (cough).    Historical Provider, MD  Pyridoxine HCl (VITAMIN B-6 PO) Take 1 tablet  by mouth daily.    Historical Provider, MD  ranitidine (ZANTAC) 75 MG tablet Take 75 mg by mouth 2 (two) times daily as needed for heartburn.    Historical Provider, MD  VITAMIN D, CHOLECALCIFEROL, PO Take 1 capsule by mouth daily.    Historical Provider, MD  zolpidem (AMBIEN) 5 MG tablet Take 1 tablet (5 mg total) by mouth at bedtime as needed for sleep. 08/05/14   Azalia BilisKevin Campos, MD   BP 124/88 mmHg  Pulse 72  Temp(Src) 98.2 F (36.8 C)  Resp 18  SpO2 97% Physical Exam  Constitutional: He is oriented to person, place, and time. He appears well-developed and well-nourished.  HENT:  Head: Normocephalic and atraumatic.  Mouth/Throat: Uvula is midline, oropharynx is clear and moist and mucous membranes are normal. No trismus in the jaw. Abnormal dentition. Dental caries (right lower molar shows signs of decay.  Mild erythema.  No drainage visualized. ) present. No uvula swelling.  Mild right sided facial swelling with TTP along right lower jaw. No tongue swelling or drooling.  Patient tolerating secretions.  Phonation normal.  Airway patent.   Neck: Normal range of motion. Neck supple.  No erythema.  Right anterior neck TTP below jaw line.   Cardiovascular: Normal rate, regular rhythm and normal heart sounds.   Pulmonary/Chest: Effort normal and breath sounds normal. No respiratory distress. He has no wheezes. He has no rales.  Abdominal: Soft. He exhibits no distension.  Neurological: He is alert and oriented to person, place, and time.  Skin: Skin is warm and dry.  Psychiatric: He has a normal mood and affect.  Nursing note and vitals reviewed.   ED Course  Procedures (including critical care time)  DIAGNOSTIC STUDIES: Oxygen Saturation is 100% on room air, normal by my interpretation.    COORDINATION OF CARE: 4:36 PM Discussed treatment plan with pt at bedside and pt agreed to plan.   Labs Review Labs Reviewed  I-STAT CHEM 8, ED - Abnormal; Notable for the following:     Glucose, Bld 102 (*)    All other components within normal limits    Imaging Review Ct Maxillofacial W/cm  03/20/2015  CLINICAL DATA:  Pain in right lower teeth.  Increased swelling. EXAM: CT MAXILLOFACIAL WITH CONTRAST TECHNIQUE: Multidetector CT imaging of the maxillofacial structures was performed with intravenous contrast. Multiplanar CT image reconstructions were also generated. A small metallic BB was placed on the right temple in order to reliably differentiate right from left. CONTRAST:  75mL OMNIPAQUE IOHEXOL 300 MG/ML  SOLN COMPARISON:  None. FINDINGS: Orbits unremarkable.  Parapharyngeal spaces normal. Small cavity along the distal margin of tooth #31, images 38-39 series 4. Cavity along the distal buccal margin of tooth #30, images 36-37 of series 4, with the cavity undercutting the filling on  image 28 series 8. There is a small abscess tracking along the adjacent mandibular margin, measuring about 2.4 cm in length and about 5 mm in depth, with small locules of gas as shown on images 34 through 27 of series 3. Adjacent right facial cellulitis adjacent to the mandible and tracking under the chin and potentially to a lesser degree around to the left side. Small reactive submental lymph nodes. Right submandibular lymph node measures 1 cm in short axis on image 26 series 3. Right submental lymph node 0.7 cm in short axis on image 13 series 3. Chronic right maxillary sinusitis. IMPRESSION: 1. Superficial right perimandibular abscess along the mandibular margin, likely associated with the cavities of teeth # 30 and # 31. Interestingly there is no appreciable periapical lucency or demineralization of the alveolar ridge. Regional right facial cellulitis. No other cavities. Electronically Signed   By: Gaylyn Rong M.D.   On: 03/20/2015 17:58   I have personally reviewed and evaluated these images and lab results as part of my medical decision-making.   EKG Interpretation None      MDM    Final diagnoses:  Dental abscess  Cellulitis of face    Patient presents with 8 day history of worsening dental pain.  Patient reports he has been taking Clindamycin as prescribed from his PCP for the past 3-4 days and has seen NO improvement.  He states he thinks the left sided facial swelling has gotten worse.  Denies dysphasia, drooling, fever, chills, N/V, or neck stiffness.  VSS, NAD.  On exam, poor dentition.  Left sided facial swelling.  No trismus.  Tolerating secretions.  No signs of Ludwig angina.  At this point, I feel a CT maxillofacial is warranted to evaluate for deep neck space infection.  CT shows superficial right perimandibular abscess along the mandibular margin, likely associated with cavities.  Regional right facial cellulitis.  Will admit for IV antibiotics and oral surgery consult tomorrow.  I personally performed the services described in this documentation, which was scribed in my presence. The recorded information has been reviewed and is accurate.    Cheri Fowler, PA-C 03/20/15 1925  Marily Memos, MD 03/20/15 2239

## 2015-03-20 NOTE — ED Notes (Signed)
Per pt, states right lower tooth pain-states cavity had fallen out-increased swelling and pain-saw PCP and was giving antibiotics

## 2015-03-21 DIAGNOSIS — K047 Periapical abscess without sinus: Principal | ICD-10-CM

## 2015-03-21 DIAGNOSIS — Z8249 Family history of ischemic heart disease and other diseases of the circulatory system: Secondary | ICD-10-CM | POA: Diagnosis not present

## 2015-03-21 DIAGNOSIS — Z833 Family history of diabetes mellitus: Secondary | ICD-10-CM | POA: Diagnosis not present

## 2015-03-21 DIAGNOSIS — L03211 Cellulitis of face: Secondary | ICD-10-CM

## 2015-03-21 DIAGNOSIS — Z8614 Personal history of Methicillin resistant Staphylococcus aureus infection: Secondary | ICD-10-CM | POA: Diagnosis not present

## 2015-03-21 DIAGNOSIS — F329 Major depressive disorder, single episode, unspecified: Secondary | ICD-10-CM | POA: Diagnosis present

## 2015-03-21 DIAGNOSIS — K219 Gastro-esophageal reflux disease without esophagitis: Secondary | ICD-10-CM | POA: Diagnosis present

## 2015-03-21 DIAGNOSIS — Z823 Family history of stroke: Secondary | ICD-10-CM | POA: Diagnosis not present

## 2015-03-21 DIAGNOSIS — F17200 Nicotine dependence, unspecified, uncomplicated: Secondary | ICD-10-CM | POA: Diagnosis present

## 2015-03-21 LAB — COMPREHENSIVE METABOLIC PANEL
ALT: 20 U/L (ref 17–63)
AST: 18 U/L (ref 15–41)
Albumin: 3.7 g/dL (ref 3.5–5.0)
Alkaline Phosphatase: 50 U/L (ref 38–126)
Anion gap: 6 (ref 5–15)
BILIRUBIN TOTAL: 0.5 mg/dL (ref 0.3–1.2)
BUN: 17 mg/dL (ref 6–20)
CHLORIDE: 102 mmol/L (ref 101–111)
CO2: 26 mmol/L (ref 22–32)
CREATININE: 0.83 mg/dL (ref 0.61–1.24)
Calcium: 8.8 mg/dL — ABNORMAL LOW (ref 8.9–10.3)
Glucose, Bld: 100 mg/dL — ABNORMAL HIGH (ref 65–99)
POTASSIUM: 3.4 mmol/L — AB (ref 3.5–5.1)
Sodium: 134 mmol/L — ABNORMAL LOW (ref 135–145)
TOTAL PROTEIN: 6.9 g/dL (ref 6.5–8.1)

## 2015-03-21 LAB — CBC
HEMATOCRIT: 34.6 % — AB (ref 39.0–52.0)
Hemoglobin: 11.6 g/dL — ABNORMAL LOW (ref 13.0–17.0)
MCH: 29.5 pg (ref 26.0–34.0)
MCHC: 33.5 g/dL (ref 30.0–36.0)
MCV: 88 fL (ref 78.0–100.0)
PLATELETS: 267 10*3/uL (ref 150–400)
RBC: 3.93 MIL/uL — AB (ref 4.22–5.81)
RDW: 13.1 % (ref 11.5–15.5)
WBC: 11.7 10*3/uL — AB (ref 4.0–10.5)

## 2015-03-21 LAB — TSH: TSH: 0.885 u[IU]/mL (ref 0.350–4.500)

## 2015-03-21 LAB — PHOSPHORUS: PHOSPHORUS: 3.9 mg/dL (ref 2.5–4.6)

## 2015-03-21 LAB — MAGNESIUM: Magnesium: 2.2 mg/dL (ref 1.7–2.4)

## 2015-03-21 MED ORDER — KETOROLAC TROMETHAMINE 30 MG/ML IJ SOLN: 30.0000 mg | Freq: Three times a day (TID) | INTRAMUSCULAR | Status: DC | PRN

## 2015-03-21 MED ORDER — KETOROLAC TROMETHAMINE 30 MG/ML IJ SOLN
30.0000 mg | Freq: Once | INTRAMUSCULAR | Status: AC
  Administered 2015-03-21: 30 mg via INTRAVENOUS
  Filled 2015-03-21: qty 1

## 2015-03-21 MED ORDER — KETOROLAC TROMETHAMINE 30 MG/ML IJ SOLN
30.0000 mg | Freq: Four times a day (QID) | INTRAMUSCULAR | Status: DC | PRN
  Administered 2015-03-21 – 2015-03-23 (×5): 30 mg via INTRAVENOUS
  Filled 2015-03-21 (×6): qty 1

## 2015-03-21 MED ORDER — BUPRENORPHINE HCL 8 MG SL SUBL
8.0000 mg | SUBLINGUAL_TABLET | Freq: Two times a day (BID) | SUBLINGUAL | Status: DC
  Administered 2015-03-21 – 2015-03-23 (×4): 8 mg via SUBLINGUAL
  Filled 2015-03-21 (×4): qty 1

## 2015-03-21 MED ORDER — IBUPROFEN 200 MG PO TABS: 600.0000 mg | ORAL_TABLET | Freq: Four times a day (QID) | ORAL | Status: DC | PRN

## 2015-03-21 MED ORDER — IBUPROFEN 200 MG PO TABS
600.0000 mg | ORAL_TABLET | Freq: Four times a day (QID) | ORAL | Status: DC | PRN
  Administered 2015-03-22: 600 mg via ORAL
  Filled 2015-03-21 (×2): qty 3

## 2015-03-21 MED ORDER — NICOTINE 21 MG/24HR TD PT24
21.0000 mg | MEDICATED_PATCH | Freq: Every day | TRANSDERMAL | Status: DC
  Administered 2015-03-21 – 2015-03-22 (×2): 21 mg via TRANSDERMAL
  Filled 2015-03-21 (×3): qty 1

## 2015-03-21 MED ORDER — FAMOTIDINE 20 MG PO TABS
20.0000 mg | ORAL_TABLET | Freq: Two times a day (BID) | ORAL | Status: DC
Start: 2015-03-21 — End: 2015-03-23
  Administered 2015-03-21 – 2015-03-23 (×4): 20 mg via ORAL
  Filled 2015-03-21 (×5): qty 1

## 2015-03-21 NOTE — Progress Notes (Addendum)
[facility] Progress Note   Grant Woods  OVF:[id number]  DOB: May 04, 1970  DOA: 03/20/2015 PCP: No primary care provider on file.  Brief narrative: Grant Woods is a 44 y.o. male with h/o MRSA, heroin abuse in the past, depression who presents right lower molar pain x 1 wk and right facial swelling for 3-4 days- he initially had a fever of 101 degrees. He previously had a filling in his tooth. This tooth broke and the filling came out a little over a month ago. When the pain and swelling started, he was prescribed Clindamycin his PCP. His PCP was able to get in touch with the dentist however, the dentist stated that he would not be able to extract the tooth until the infection subsided. but swelling and pain persisted despite taking it appropriately and therefore he presented to the ER. His temperature has been 99-100 maximum since being started on clindamycin.   Subjective: Ongoing facial pain and tooth ache. No discharge from teeth/ inside his mouth.   Assessment/Plan: Principal Problem:   Dental abscess/ Facial cellulitis - Continue clindamycin -Ibuprofen and Toradol for pain control-Pepcid for GI protection -spoke with on-call maxillofacial surgeon who has reviewed his CT  films- Dr Chales Salmon does not feel that this needs emergent surgery- he recommends to continue antibiotics and come to the office on Monday for follow up  Past history of heroin abuse -He is avoiding narcotics for his pain - takes Zubsolv as outpt- will have pharmacy look into dosing an equivalent  - he missed his appt to obtain the prescription yesterday - can go obtain the prescription on Monday   Code Status:     Code Status Orders        Start     Ordered   03/20/15 2103  Full code   Continuous     03/20/15 2106     Family Communication:  Disposition Plan:  DVT prophylaxis: SCDs Consultants: Procedures:  Antibiotics: Anti-infectives    Start     Dose/Rate Route Frequency Ordered Stop    03/21/15 0000  clindamycin (CLEOCIN) IVPB 900 mg     900 mg 100 mL/hr over 30 Minutes Intravenous 4 times per day 03/20/15 2106     03/20/15 1815  clindamycin (CLEOCIN) IVPB 600 mg     600 mg 100 mL/hr over 30 Minutes Intravenous  Once 03/20/15 1815 03/20/15 1921      Objective: Filed Weights   03/20/15 2001  Weight: 104.509 kg (230 lb 6.4 oz)    Intake/Output Summary (Last 24 hours) at 03/21/15 1029 Last data filed at 03/21/15 0500  Gross per 24 hour  Intake    322 ml  Output      0 ml  Net    322 ml     Vitals Filed Vitals:   03/20/15 1628 03/20/15 1853 03/20/15 2001 03/21/15 0500  BP: 135/80 124/88 157/95 127/74  Pulse: 85 72 83 79  Temp: 98.2 F (36.8 C)  97.5 F (36.4 C) 98 F (36.7 C)  TempSrc:   Oral Oral  Resp: Height:    (1.88 m)   Weight:   104.509 kg (230 lb 6.4 oz)   SpO2: 100% 97% 100% 100%    Exam:  General:  Pt is alert, not in acute distress  HEENT: No icterus, No thrush, oral mucosa moist- right mandibular swelling - broken right 3rd molar-   Cardiovascular: regular rate and rhythm, S1/S2 No murmur  Respiratory: clear  to auscultation bilaterally   Abdomen: Soft, +Bowel sounds, non tender, non distended, no guarding  MSK: No LE edema, cyanosis or clubbing  Data Reviewed: Basic Metabolic Panel:  Recent Labs Lab 03/20/15 1715 03/21/15 0423  NA 140 134*  K 3.8 3.4*  CL 104 102  CO2  --  26  GLUCOSE 102* 100*  BUN 17 17  CREATININE 0.70 0.83  CALCIUM  --  8.8*  MG  --  2.2  PHOS  --  3.9   Liver Function Tests:  Recent Labs Lab 03/21/15 0423  AST 18  ALT 20  ALKPHOS 50  BILITOT 0.5  PROT 6.9  ALBUMIN 3.7   No results for input(s): LIPASE, AMYLASE in the last 168 hours. No results for input(s): AMMONIA in the last 168 hours. CBC:  Recent Labs Lab 03/20/15 1715 03/21/15 0423  WBC  --  11.7*  HGB 13.9 11.6*  HCT 41.0 34.6*  MCV  --  88.0  PLT  --  267   Cardiac Enzymes: No results for  input(s): CKTOTAL, CKMB, CKMBINDEX, TROPONINI in the last 168 hours. BNP (last 3 results) No results for input(s): BNP in the last 8760 hours.  ProBNP (last 3 results) No results for input(s): PROBNP in the last 8760 hours.  CBG: No results for input(s): GLUCAP in the last 168 hours.  No results found for this or any previous visit (from the past 240 hour(s)).   Studies: Ct Maxillofacial W/cm  03/20/2015  CLINICAL DATA:  Pain in right lower teeth.  Increased swelling. EXAM: CT MAXILLOFACIAL WITH CONTRAST TECHNIQUE: Multidetector CT imaging of the maxillofacial structures was performed with intravenous contrast. Multiplanar CT image reconstructions were also generated. A small metallic BB was placed on the right temple in order to reliably differentiate right from left. CONTRAST:  75mL OMNIPAQUE IOHEXOL 300 MG/ML  SOLN COMPARISON:  None. FINDINGS: Orbits unremarkable.  Parapharyngeal spaces normal. Small cavity along the distal margin of tooth #31, images 38-39 series 4. Cavity along the distal buccal margin of tooth #30, images 36-37 of series 4, with the cavity undercutting the filling on image 28 series 8. There is a small abscess tracking along the adjacent mandibular margin, measuring about 2.4 cm in length and about 5 mm in depth, with small locules of gas as shown on images 34 through 27 of series 3. Adjacent right facial cellulitis adjacent to the mandible and tracking under the chin and potentially to a lesser degree around to the left side. Small reactive submental lymph nodes. Right submandibular lymph node measures 1 cm in short axis on image 26 series 3. Right submental lymph node 0.7 cm in short axis on image 13 series 3. Chronic right maxillary sinusitis. IMPRESSION: 1. Superficial right perimandibular abscess along the mandibular margin, likely associated with the cavities of teeth # 30 and # 31. Interestingly there is no appreciable periapical lucency or demineralization of the alveolar  ridge. Regional right facial cellulitis. No other cavities. Electronically Signed   By: Gaylyn RongWalter  Liebkemann M.D.   On: 03/20/2015 17:58    Scheduled Meds:  Scheduled Meds: . clindamycin (CLEOCIN) IV  900 mg Intravenous 4 times per day  . sodium chloride  3 mL Intravenous Q12H   Continuous Infusions:   Time spent on care of this patient: 35 min   Suzy Kugel, MD 03/21/2015, 10:29 AM    [facility] Office  [phone] Pager - Text Page per [email or web address] If 7PM-7AM, please contact night-coverage [email or web address]

## 2015-03-22 NOTE — Progress Notes (Signed)
[facility] Progress Note   Grant Woods  WGN:[id number]  DOB: 1970/09/17  DOA: 03/20/2015 PCP: No primary care provider on file.  Brief narrative: Grant Woods is a 44 y.o. male with h/o MRSA, heroin abuse in the past, depression who presents right lower molar pain x 1 wk and right facial swelling for 3-4 days- he initially had a fever of 101 degrees. He previously had a filling in his tooth. This tooth broke and the filling came out a little over a month ago. When the pain and swelling started, he was prescribed Clindamycin his PCP. His PCP was able to get in touch with the dentist however, the dentist stated that he would not be able to extract the tooth until the infection subsided. but swelling and pain persisted despite taking it appropriately and therefore he presented to the ER. His temperature has been 99-100 maximum since being started on clindamycin.   Subjective: Developing pimples in his gums now. Facial swelling a bit better.  Assessment/Plan: Principal Problem:   Dental abscess/ Facial cellulitis - Continue clindamycin -Ibuprofen and Toradol for pain control-Pepcid for GI protection -spoke with on-call maxillofacial surgeon who has reviewed his CT  films- Dr Chales Salmon does not feel that this needs emergent surgery- he recommends to continue antibiotics and come to the office on Monday for follow up - the on call dentist, Dr Jolyne Loa also states that this abscess does not need drainage at this time  Past history of heroin abuse -He is avoiding narcotics for his pain - takes Zubsolv as outpt- will have pharmacy started Subutex  - he missed his appt to obtain the prescription on Friday - can go obtain the prescription on Monday   Code Status:     Code Status Orders        Start     Ordered   03/20/15 2103  Full code   Continuous     03/20/15 2106     Family Communication:  Disposition Plan:  DVT prophylaxis:  SCDs Consultants: Procedures:  Antibiotics: Anti-infectives    Start     Dose/Rate Route Frequency Ordered Stop   03/21/15 0000  clindamycin (CLEOCIN) IVPB 900 mg     900 mg 100 mL/hr over 30 Minutes Intravenous 4 times per day 03/20/15 2106     03/20/15 1815  clindamycin (CLEOCIN) IVPB 600 mg     600 mg 100 mL/hr over 30 Minutes Intravenous  Once 03/20/15 1815 03/20/15 1921      Objective: Filed Weights   03/20/15 2001  Weight: 104.509 kg (230 lb 6.4 oz)    Intake/Output Summary (Last 24 hours) at 03/22/15 1539 Last data filed at 03/22/15 1300  Gross per 24 hour  Intake    480 ml  Output      0 ml  Net    480 ml     Vitals Filed Vitals:   03/21/15 1500 03/21/15 2010 03/22/15 0506 03/22/15 1424  BP: 127/71 130/88 126/82 132/77  Pulse: 70 73 68 77  Temp: 97.4 F (36.3 C) 97.7 F (36.5 C) 98.6 F (37 C) 98 F (36.7 C)  TempSrc: Oral Axillary Oral Oral  Resp: Height:      Weight:      SpO2: 100% 98% 99% 96%    Exam:  General:  Pt is alert, not in acute distress  HEENT: No icterus, No thrush, oral mucosa moist- right mandibular swelling - broken right 3rd molar- pus filled swelling in  gums on outside of right 3rd molar  Cardiovascular: regular rate and rhythm, S1/S2 No murmur  Respiratory: clear to auscultation bilaterally   Abdomen: Soft, +Bowel sounds, non tender, non distended, no guarding  MSK: No LE edema, cyanosis or clubbing  Data Reviewed: Basic Metabolic Panel:  Recent Labs Lab 03/20/15 1715 03/21/15 0423  NA 140 134*  K 3.8 3.4*  CL 104 102  CO2  --  26  GLUCOSE 102* 100*  BUN 17 17  CREATININE 0.70 0.83  CALCIUM  --  8.8*  MG  --  2.2  PHOS  --  3.9   Liver Function Tests:  Recent Labs Lab 03/21/15 0423  AST 18  ALT 20  ALKPHOS 50  BILITOT 0.5  PROT 6.9  ALBUMIN 3.7   No results for input(s): LIPASE, AMYLASE in the last 168 hours. No results for input(s): AMMONIA in the last 168 hours. CBC:  Recent  Labs Lab 03/20/15 1715 03/21/15 0423  WBC  --  11.7*  HGB 13.9 11.6*  HCT 41.0 34.6*  MCV  --  88.0  PLT  --  267   Cardiac Enzymes: No results for input(s): CKTOTAL, CKMB, CKMBINDEX, TROPONINI in the last 168 hours. BNP (last 3 results) No results for input(s): BNP in the last 8760 hours.  ProBNP (last 3 results) No results for input(s): PROBNP in the last 8760 hours.  CBG: No results for input(s): GLUCAP in the last 168 hours.  No results found for this or any previous visit (from the past 240 hour(s)).   Studies: Ct Maxillofacial W/cm  03/20/2015  CLINICAL DATA:  Pain in right lower teeth.  Increased swelling. EXAM: CT MAXILLOFACIAL WITH CONTRAST TECHNIQUE: Multidetector CT imaging of the maxillofacial structures was performed with intravenous contrast. Multiplanar CT image reconstructions were also generated. A small metallic BB was placed on the right temple in order to reliably differentiate right from left. CONTRAST:  75mL OMNIPAQUE IOHEXOL 300 MG/ML  SOLN COMPARISON:  None. FINDINGS: Orbits unremarkable.  Parapharyngeal spaces normal. Small cavity along the distal margin of tooth #31, images 38-39 series 4. Cavity along the distal buccal margin of tooth #30, images 36-37 of series 4, with the cavity undercutting the filling on image 28 series 8. There is a small abscess tracking along the adjacent mandibular margin, measuring about 2.4 cm in length and about 5 mm in depth, with small locules of gas as shown on images 34 through 27 of series 3. Adjacent right facial cellulitis adjacent to the mandible and tracking under the chin and potentially to a lesser degree around to the left side. Small reactive submental lymph nodes. Right submandibular lymph node measures 1 cm in short axis on image 26 series 3. Right submental lymph node 0.7 cm in short axis on image 13 series 3. Chronic right maxillary sinusitis. IMPRESSION: 1. Superficial right perimandibular abscess along the mandibular  margin, likely associated with the cavities of teeth # 30 and # 31. Interestingly there is no appreciable periapical lucency or demineralization of the alveolar ridge. Regional right facial cellulitis. No other cavities. Electronically Signed   By: Gaylyn RongWalter  Liebkemann M.D.   On: 03/20/2015 17:58    Scheduled Meds:  Scheduled Meds: . buprenorphine  8 mg Sublingual BID  . clindamycin (CLEOCIN) IV  900 mg Intravenous 4 times per day  . famotidine  20 mg Oral BID  . nicotine  21 mg Transdermal Daily  . sodium chloride  3 mL Intravenous Q12H   Continuous Infusions:  Time spent on care of this patient: 35 min   Siearra Amberg, MD 03/22/2015, 3:39 PM  LOS: 1 day   [facility] Office  [phone] Pager - Text Page per [email or web address] If 7PM-7AM, please contact night-coverage [email or web address]

## 2015-03-22 NOTE — Progress Notes (Signed)
PHARMACY NOTE -  ANTIBIOTIC RENAL DOSE ADJUSTMENT   Request received for Pharmacy to assist with antibiotic renal dose adjustment.  Patient has been initiated on Clindamycin 900mg  IV q6h for dental abscess / facial cellulitis. SCr 0.83, estimated CrCl >100 ml/min Current dosage is appropriate and need for further dosage adjustment appears unlikely at present. Will sign off at this time.  Please reconsult if a change in clinical status warrants re-evaluation of dosage.  Loralee PacasErin Nathalia Wismer, PharmD, BCPS Pager: [phone] 03/22/2015 1:12 PM

## 2015-03-23 LAB — CBC
HEMATOCRIT: 34.6 % — AB (ref 39.0–52.0)
HEMOGLOBIN: 11.3 g/dL — AB (ref 13.0–17.0)
MCH: 28.9 pg (ref 26.0–34.0)
MCHC: 32.7 g/dL (ref 30.0–36.0)
MCV: 88.5 fL (ref 78.0–100.0)
Platelets: 275 10*3/uL (ref 150–400)
RBC: 3.91 MIL/uL — ABNORMAL LOW (ref 4.22–5.81)
RDW: 13 % (ref 11.5–15.5)
WBC: 7.9 10*3/uL (ref 4.0–10.5)

## 2015-03-23 LAB — BASIC METABOLIC PANEL
Anion gap: 7 (ref 5–15)
BUN: 19 mg/dL (ref 6–20)
CALCIUM: 8.9 mg/dL (ref 8.9–10.3)
CHLORIDE: 102 mmol/L (ref 101–111)
CO2: 28 mmol/L (ref 22–32)
CREATININE: 0.85 mg/dL (ref 0.61–1.24)
GFR calc Af Amer: >60 mL/min (ref >60)
GFR calc non Af Amer: >60 mL/min (ref >60)
Glucose, Bld: 105 mg/dL — ABNORMAL HIGH (ref 65–99)
Potassium: 3.7 mmol/L (ref 3.5–5.1)
SODIUM: 137 mmol/L (ref 135–145)

## 2015-03-23 NOTE — Discharge Summary (Signed)
Physician Discharge Summary  Grant ChestnutWilliam E Oleary WUJ:[id number] DOB: Sep 01, 1970 DOA: 03/20/2015  PCP: No primary care provider on file.  Admit date: 03/20/2015 Discharge date: 03/23/2015  Time spent: 50 minutes  Recommendations for Outpatient Follow-up:  1. F/u with maxillofacial surgeon or dentist   Discharge Condition: stable    Discharge Diagnoses:  Principal Problem:   Dental abscess Active Problems:   Facial cellulitis   Brief narrative: Grant Woods is a 44 y.o. male with h/o MRSA, heroin abuse in the past, depression who presents right lower molar pain x 1 wk and right facial swelling for 3-4 days- he initially had a fever of 101 degrees. He previously had a filling in his tooth. This tooth broke and the filling came out a little over a month ago. When the pain and swelling started, he was prescribed Clindamycin his PCP. His PCP was able to get in touch with the dentist however, the dentist stated that he would not be able to extract the tooth until the infection subsided. but swelling and pain persisted despite taking it appropriately and therefore he presented to the ER. His temperature has been 99-100 maximum since being started on clindamycin.   Subjective: Principal Problem:  Dental abscess/ Facial cellulitis -given IV clindamycin -Ibuprofen and Toradol given for pain control-Pepcid for GI protection -spoke with on-call maxillofacial surgeon who has reviewed his CT films- Dr Chales Salmonwsley does not feel that this needs emergent surgery- he recommends to continue antibiotics and come to the office on Monday for follow up - the on call dentist, Dr Jolyne LoaFarland also states that this abscess does not need drainage at this time - abscesses has matured and appears to have draining overnight- still has gum swelling - being discharged on oral Clindamycin today- he knows to f/u with either a dentist or a maxillofacial surgeon- number for Dr Chales Salmonwsley given- he will look for one that is covered  under his insurance  Past history of heroin abuse -He is avoiding narcotics for his pain - takes Zubsolv as outpt- substituted with Subutex in hospital - he missed his appt to obtain the prescription on Friday - can go obtain the prescription on Monday  Discharge Exam: Filed Weights   03/20/15 2001  Weight: 104.509 kg (230 lb 6.4 oz)   Filed Vitals:   03/22/15 2023 03/23/15 0441  BP: 116/64 123/65  Pulse: 64 60  Temp: 98 F (36.7 C) 97.6 F (36.4 C)  Resp: 16 16    General: AAO x 3, no distress Cardiovascular: RRR, no murmurs  Respiratory: clear to auscultation bilaterally GI: soft, non-tender, non-distended, bowel sound positive  Discharge Instructions You were cared for by a hospitalist during your hospital stay. If you have any questions about your discharge medications or the care you received while you were in the hospital after you are discharged, you can call the unit and asked to speak with the hospitalist on call if the hospitalist that took care of you is not available. Once you are discharged, your primary care physician will handle any further medical issues. Please note that NO REFILLS for any discharge medications will be authorized once you are discharged, as it is imperative that you return to your primary care physician (or establish a relationship with a primary care physician if you do not have one) for your aftercare needs so that they can reassess your need for medications and monitor your lab values.  Discharge Instructions    Diet - low sodium heart healthy  Complete by:  As directed      Increase activity slowly    Complete by:  As directed             Medication List    TAKE these medications        acetaminophen 500 MG tablet  Commonly known as:  TYLENOL  Take 1,000 mg by mouth every 8 (eight) hours as needed for moderate pain.     calcium-vitamin D 500-200 MG-UNIT tablet  Commonly known as:  OSCAL WITH D  Take 1 tablet by mouth daily with  breakfast.     clindamycin 300 MG capsule  Commonly known as:  CLEOCIN  Take 300 mg by mouth 4 (four) times daily.     ibuprofen 200 MG tablet  Commonly known as:  ADVIL,MOTRIN  Take 800 mg by mouth every 8 (eight) hours as needed for moderate pain.     multivitamin with minerals Tabs tablet  Take 1 tablet by mouth daily.     POTASSIUM GLUCONATE PO  Take 1 tablet by mouth daily.     ranitidine 75 MG tablet  Commonly known as:  ZANTAC  Take 75 mg by mouth 2 (two) times daily as needed for heartburn.     ZUBSOLV 5.7-1.4 MG Subl  Generic drug:  Buprenorphine HCl-Naloxone HCl  Place 1 tablet under the tongue 2 (two) times daily. 6 AM and 4 PM       Allergies  Allergen Reactions  . Eggs Or Egg-Derived Products Anaphylaxis and Swelling  . Penicillins Hives    Has patient had a PCN reaction causing immediate rash, facial/tongue/throat swelling, SOB or lightheadedness with hypotension: unknown Has patient had a PCN reaction causing severe rash involving mucus membranes or skin necrosis: unknown Has patient had a PCN reaction that required hospitalization unknown Has patient had a PCN reaction occurring within the last 10 years: no If all of the above answers are "NO", then may proceed with Cephalosporin use.  Childhood allergy       Follow-up Information    Follow up with Beatriz Chancellor, MD.   Specialty:  Oral Surgery   Contact information:   [address] [place] [address] [phone]        The results of significant diagnostics from this hospitalization (including imaging, microbiology, ancillary and laboratory) are listed below for reference.    Significant Diagnostic Studies: Ct Maxillofacial W/cm  03/20/2015  CLINICAL DATA:  Pain in right lower teeth.  Increased swelling. EXAM: CT MAXILLOFACIAL WITH CONTRAST TECHNIQUE: Multidetector CT imaging of the maxillofacial structures was performed with intravenous contrast. Multiplanar CT image reconstructions  were also generated. A small metallic BB was placed on the right temple in order to reliably differentiate right from left. CONTRAST:  75mL OMNIPAQUE IOHEXOL 300 MG/ML  SOLN COMPARISON:  None. FINDINGS: Orbits unremarkable.  Parapharyngeal spaces normal. Small cavity along the distal margin of tooth #31, images 38-39 series 4. Cavity along the distal buccal margin of tooth #30, images 36-37 of series 4, with the cavity undercutting the filling on image 28 series 8. There is a small abscess tracking along the adjacent mandibular margin, measuring about 2.4 cm in length and about 5 mm in depth, with small locules of gas as shown on images 34 through 27 of series 3. Adjacent right facial cellulitis adjacent to the mandible and tracking under the chin and potentially to a lesser degree around to the left side. Small reactive submental lymph nodes. Right submandibular lymph node measures 1  cm in short axis on image 26 series 3. Right submental lymph node 0.7 cm in short axis on image 13 series 3. Chronic right maxillary sinusitis. IMPRESSION: 1. Superficial right perimandibular abscess along the mandibular margin, likely associated with the cavities of teeth # 30 and # 31. Interestingly there is no appreciable periapical lucency or demineralization of the alveolar ridge. Regional right facial cellulitis. No other cavities. Electronically Signed   By: Gaylyn Rong M.D.   On: 03/20/2015 17:58    Microbiology: No results found for this or any previous visit (from the past 240 hour(s)).   Labs: Basic Metabolic Panel:  Recent Labs Lab 03/20/15 1715 03/21/15 0423 03/23/15 0351  NA 140 134* 137  K 3.8 3.4* 3.7  CL 104 102 102  CO2  --  26 28  GLUCOSE 102* 100* 105*  BUN CREATININE 0.70 0.83 0.85  CALCIUM  --  8.8* 8.9  MG  --  2.2  --   PHOS  --  3.9  --    Liver Function Tests:  Recent Labs Lab 03/21/15 0423  AST 18  ALT 20  ALKPHOS 50  BILITOT 0.5  PROT 6.9  ALBUMIN 3.7    No results for input(s): LIPASE, AMYLASE in the last 168 hours. No results for input(s): AMMONIA in the last 168 hours. CBC:  Recent Labs Lab 03/20/15 1715 03/21/15 0423 03/23/15 0351  WBC  --  11.7* 7.9  HGB 13.9 11.6* 11.3*  HCT 41.0 34.6* 34.6*  MCV  --  88.0 88.5  PLT  --  267 275   Cardiac Enzymes: No results for input(s): CKTOTAL, CKMB, CKMBINDEX, TROPONINI in the last 168 hours. BNP: BNP (last 3 results) No results for input(s): BNP in the last 8760 hours.  ProBNP (last 3 results) No results for input(s): PROBNP in the last 8760 hours.  CBG: No results for input(s): GLUCAP in the last 168 hours.     SignedCalvert Cantor, MD [facility] 03/23/2015, 11:52 AM

## 2016-06-06 DIAGNOSIS — R05 Cough: Secondary | ICD-10-CM | POA: Diagnosis not present

## 2016-06-06 DIAGNOSIS — J111 Influenza due to unidentified influenza virus with other respiratory manifestations: Secondary | ICD-10-CM | POA: Diagnosis not present

## 2016-06-07 ENCOUNTER — Telehealth: Payer: Self-pay | Admitting: Emergency Medicine

## 2016-06-07 NOTE — Telephone Encounter (Signed)
Noted - thank you very much!

## 2016-06-07 NOTE — Telephone Encounter (Signed)
Called and spoke with pt attempting to complete PreVisit information pt states that he was in urgent care last night and diagnosed with the flu and would like for me to call back at another time.

## 2016-06-09 ENCOUNTER — Encounter: Payer: Self-pay | Admitting: Family Medicine

## 2016-06-09 ENCOUNTER — Ambulatory Visit (INDEPENDENT_AMBULATORY_CARE_PROVIDER_SITE_OTHER): Payer: BLUE CROSS/BLUE SHIELD | Admitting: Family Medicine

## 2016-06-09 ENCOUNTER — Encounter: Payer: Self-pay | Admitting: Surgical

## 2016-06-09 VITALS — BP 110/72 | HR 75 | Temp 98.4°F | Ht 74.0 in | Wt 239.4 lb

## 2016-06-09 DIAGNOSIS — G4730 Sleep apnea, unspecified: Secondary | ICD-10-CM | POA: Insufficient documentation

## 2016-06-09 DIAGNOSIS — E6609 Other obesity due to excess calories: Secondary | ICD-10-CM | POA: Diagnosis not present

## 2016-06-09 DIAGNOSIS — F17200 Nicotine dependence, unspecified, uncomplicated: Secondary | ICD-10-CM | POA: Diagnosis not present

## 2016-06-09 DIAGNOSIS — Z6830 Body mass index (BMI) 30.0-30.9, adult: Secondary | ICD-10-CM | POA: Insufficient documentation

## 2016-06-09 DIAGNOSIS — M7662 Achilles tendinitis, left leg: Secondary | ICD-10-CM

## 2016-06-09 DIAGNOSIS — F1120 Opioid dependence, uncomplicated: Secondary | ICD-10-CM | POA: Diagnosis not present

## 2016-06-09 DIAGNOSIS — F1121 Opioid dependence, in remission: Secondary | ICD-10-CM | POA: Diagnosis not present

## 2016-06-09 DIAGNOSIS — M7661 Achilles tendinitis, right leg: Secondary | ICD-10-CM | POA: Diagnosis not present

## 2016-06-09 DIAGNOSIS — M1711 Unilateral primary osteoarthritis, right knee: Secondary | ICD-10-CM

## 2016-06-09 DIAGNOSIS — G479 Sleep disorder, unspecified: Secondary | ICD-10-CM | POA: Diagnosis not present

## 2016-06-09 DIAGNOSIS — Z720 Tobacco use: Secondary | ICD-10-CM | POA: Insufficient documentation

## 2016-06-09 MED ORDER — ALBUTEROL SULFATE HFA 108 (90 BASE) MCG/ACT IN AERS
2.0000 | INHALATION_SPRAY | Freq: Four times a day (QID) | RESPIRATORY_TRACT | 2 refills | Status: DC | PRN
Start: 2016-06-09 — End: 2019-10-18

## 2016-06-09 NOTE — Progress Notes (Signed)
Pre visit review using our clinic review tool, if applicable. No additional management support is needed unless otherwise documented below in the visit note. 

## 2016-06-09 NOTE — Patient Instructions (Signed)
Please think about quitting smoking.  This is very important for your health.  There are many things we can do to help you quit.  Let  us know if you are interested.  You can also call 1-800-QUIT-NOW [phone]([phone]) for free smoking cessation counseling.

## 2016-06-09 NOTE — Progress Notes (Signed)
Grant Woods is a 46 y.o. male is here to [facility].   History of Present Illness:    1. Primary osteoarthritis of right knee. Hx of right knee arthroscopy. Still having intermittent pain, worse after standing or walking for a long period of time. No locking, clicking, popping. Some swelling intermittently that resolves within a day or two.    2. Achilles tendinitis of both lower extremities. Tight, very tender, base of achilles, bilaterally. Chronic but worsening. Starting to inhibit exercise.    3. Class 1 obesity due to excess calories without serious comorbidity with body mass index (BMI) of 30.0 to 30.9 in adult. Exercises regularly. Does not watch diet.    4. Current every day smoker. Not interested in quitting today.    5. Sleep disturbance. Girlfriend says that he stops breathing at night. Excessive sleep somnolence. Snores loudly. Interested in sleep study.    6. Heroin use disorder, moderate, in sustained remission (HCC). Abstaining.    7. Opioid use disorder, moderate, on maintenance therapy (HCC). Continues on maintenance medication. Followed monthly.     There are no preventive care reminders to display for this patient.   PMHx, SurgHx, SocialHx, Medications, and Allergies were reviewed in the Visit Navigator and updated as appropriate.    Past Medical History:  Diagnosis Date  . Allergy   . Back pain   . Depression   . GERD (gastroesophageal reflux disease)   . Heroin use disorder, moderate, in sustained remission (HCC)   . MRSA (methicillin resistant Staphylococcus aureus)   . Opioid use disorder, moderate, on maintenance therapy [facility])     Past Surgical History:  Procedure Laterality Date  . KNEE ARTHROSCOPY Right     Family History  Problem Relation Age of Onset  . Diabetes type II Mother   . Stroke Mother   . Hypertension Mother   . Diabetes type II Father     Social History  Substance Use Topics  . Smoking status: Current Every Day  Smoker    Packs/day: 1.00  . Smokeless tobacco: Never Used  . Alcohol use Yes     Comment: occasional     Current Medications and Allergies:    Current Outpatient Prescriptions:  .  acetaminophen (TYLENOL) 500 MG tablet, Take 1,000 mg by mouth every 8 (eight) hours as needed for moderate pain., Disp: , Rfl:  .  Buprenorphine HCl-Naloxone HCl (ZUBSOLV) 5.7-1.4 MG SUBL, Place 1 tablet under the tongue 2 (two) times daily. 6 AM and 4 PM, Disp: , Rfl:  .  calcium-vitamin D (OSCAL WITH D) 500-200 MG-UNIT tablet, Take 1 tablet by mouth daily with breakfast., Disp: , Rfl:  .  ibuprofen (ADVIL,MOTRIN) 200 MG tablet, Take 800 mg by mouth every 8 (eight) hours as needed for moderate pain., Disp: , Rfl:  .  Multiple Vitamin (MULTIVITAMIN WITH MINERALS) TABS tablet, Take 1 tablet by mouth daily., Disp: , Rfl:  .  POTASSIUM GLUCONATE PO, Take 1 tablet by mouth daily., Disp: , Rfl:  .  ranitidine (ZANTAC) 75 MG tablet, Take 75 mg by mouth 2 (two) times daily as needed for heartburn., Disp: , Rfl:  .  albuterol (PROVENTIL HFA;VENTOLIN HFA) 108 (90 Base) MCG/ACT inhaler, Inhale 2 puffs into the lungs every 6 (six) hours as needed for wheezing or shortness of breath., Disp: 1 Inhaler, Rfl: 2   Allergies  Allergen Reactions  . Eggs Or Egg-Derived Products Anaphylaxis and Swelling  . Penicillins Hives     Patient Information  Form: Screening and ROS   Review of Systems  General:  Negative for unexplained weight loss, fever Skin: Negative for new or changing mole, sore that won't heal HEENT: Negative for trouble hearing, trouble seeing, ringing in ears, mouth sores, hoarseness, change in voice, dysphagia CV:  Negative for chest pain, dyspnea, edema, palpitations Resp: Negative for cough, dyspnea, hemoptysis GI: Negative for nausea, vomiting, diarrhea, constipation, abdominal pain, melena, hematochezia GU: Negative for dysuria, incontinence, urinary hesitance, hematuria, vaginal or penile discharge,  polyuria Neuro: Negative for headaches, weakness, numbness, dizziness, passing out/fainting Psych: Negative for depression, anxiety, memory problems   Vitals:   Vitals:   06/09/16 0754  BP: 110/72  Pulse: 75  Temp: 98.4 F (36.9 C)  TempSrc: Oral  SpO2: 96%  Weight: 239 lb 6.4 oz (108.6 kg)  Height: 6\' 2"  (1.88 m)     Body mass index is 30.74 kg/m.   Physical Exam:    General: Alert, cooperative, appears stated age and no distress.  HEENT:  Normocephalic, without obvious abnormality, atraumatic. Conjunctivae/corneas clear. PERRL, EOM's intact. Normal TM's and external ear canals both ears. Nares normal. Septum midline. Mucosa normal. No drainage or sinus tenderness. Lips, mucosa, and tongue normal; teeth and gums normal.  Lungs: Clear to auscultation bilaterally.  Heart:: Regular rate and rhythm, S1, S2 normal, no murmur, click, rub or gallop.  Abdomen: Soft, non-tender; bowel sounds normal; no masses,  no organomegaly.  Extremities: Extremities normal, atraumatic, no cyanosis or edema.  Pulses: 2+ and symmetric.  Skin: Skin color, texture, turgor normal. No rashes or lesions.  Neurologic: Alert and oriented X 3, normal strength and tone. Normal symmetric. reflexes. Normal coordination and gait.  Psych: Alert,oriented, in NAD with a full range of affect, normal behavior and no psychotic features  Msk: Right knee with OA changes, no effusion, + crepitus. Bilateral achilles insertion with some swelling, very ttp, tight calf mm.     Assessment and Plan:    Grant Woods was seen today for establish care, ankle pain and knee pain.  Diagnoses and all orders for this visit:  Primary osteoarthritis of right knee -     AMB referral to sports medicine  Achilles tendinitis of both lower extremities -     AMB referral to sports medicine  Class 1 obesity due to excess calories without serious comorbidity with body mass index (BMI) of 30.0 to 30.9 in adult -     Lipid panel;  Future -     Comprehensive metabolic panel; Future  Current every day smoker -     albuterol (PROVENTIL HFA;VENTOLIN HFA) 108 (90 Base) MCG/ACT inhaler; Inhale 2 puffs into the lungs every 6 (six) hours as needed for wheezing or shortness of breath.  Sleep disturbance -     Home sleep test; Future  Heroin use disorder, moderate, in sustained remission (HCC)  Opioid use disorder, moderate, on maintenance therapy (HCC)  . Reviewed expectations re: course of current medical issues. . Discussed self-management of symptoms. . Outlined signs and symptoms indicating need for more acute intervention. . Patient verbalized understanding and all questions were answered. . See orders for this visit as documented in the electronic medical record. . Patient received an After Visit Summary.  Records requested if needed. I spent 45 minutes with this patient, greater than 50% was face-to-face time counseling regarding the above diagnoses.  Helane RimaErica Kody Brandl, D.O. [place] [place] 06/09/2016   Follow-up: 1-2 months.  Meds ordered this encounter  Medications  . albuterol (PROVENTIL HFA;VENTOLIN  HFA) 108 (90 Base) MCG/ACT inhaler    Sig: Inhale 2 puffs into the lungs every 6 (six) hours as needed for wheezing or shortness of breath.    Dispense:  1 Inhaler    Refill:  2   Medications Discontinued During This Encounter  Medication Reason  . clindamycin (CLEOCIN) 300 MG capsule Finished   Orders Placed This Encounter  Procedures  . Lipid panel  . Comprehensive metabolic panel  . AMB referral to sports medicine  . Home sleep test

## 2016-06-11 ENCOUNTER — Encounter: Payer: Self-pay | Admitting: Family Medicine

## 2016-06-12 ENCOUNTER — Encounter (HOSPITAL_BASED_OUTPATIENT_CLINIC_OR_DEPARTMENT_OTHER): Payer: Self-pay | Admitting: *Deleted

## 2016-06-12 ENCOUNTER — Emergency Department (HOSPITAL_BASED_OUTPATIENT_CLINIC_OR_DEPARTMENT_OTHER): Payer: BLUE CROSS/BLUE SHIELD

## 2016-06-12 ENCOUNTER — Emergency Department (HOSPITAL_BASED_OUTPATIENT_CLINIC_OR_DEPARTMENT_OTHER)
Admission: EM | Admit: 2016-06-12 | Discharge: 2016-06-12 | Disposition: A | Discharge status: Home / Self Care | Payer: BLUE CROSS/BLUE SHIELD | Attending: Emergency Medicine | Admitting: Emergency Medicine

## 2016-06-12 DIAGNOSIS — R1110 Vomiting, unspecified: Secondary | ICD-10-CM | POA: Insufficient documentation

## 2016-06-12 DIAGNOSIS — J111 Influenza due to unidentified influenza virus with other respiratory manifestations: Secondary | ICD-10-CM

## 2016-06-12 DIAGNOSIS — F17200 Nicotine dependence, unspecified, uncomplicated: Secondary | ICD-10-CM | POA: Diagnosis not present

## 2016-06-12 DIAGNOSIS — R05 Cough: Secondary | ICD-10-CM | POA: Insufficient documentation

## 2016-06-12 DIAGNOSIS — R197 Diarrhea, unspecified: Secondary | ICD-10-CM | POA: Diagnosis not present

## 2016-06-12 DIAGNOSIS — R69 Illness, unspecified: Secondary | ICD-10-CM

## 2016-06-12 DIAGNOSIS — Z791 Long term (current) use of non-steroidal anti-inflammatories (NSAID): Secondary | ICD-10-CM | POA: Insufficient documentation

## 2016-06-12 DIAGNOSIS — Z79899 Other long term (current) drug therapy: Secondary | ICD-10-CM | POA: Insufficient documentation

## 2016-06-12 DIAGNOSIS — F17210 Nicotine dependence, cigarettes, uncomplicated: Secondary | ICD-10-CM | POA: Diagnosis not present

## 2016-06-12 LAB — CBC WITH DIFFERENTIAL/PLATELET
BASOS ABS: 0 10*3/uL (ref 0.0–0.1)
Basophils Relative: 0 %
EOS ABS: 0.3 10*3/uL (ref 0.0–0.7)
Eosinophils Relative: 2 %
HEMATOCRIT: 36.9 % — AB (ref 39.0–52.0)
HEMOGLOBIN: 12.6 g/dL — AB (ref 13.0–17.0)
LYMPHS PCT: 34 %
Lymphs Abs: 5 10*3/uL — ABNORMAL HIGH (ref 0.7–4.0)
MCH: 29.3 pg (ref 26.0–34.0)
MCHC: 34.1 g/dL (ref 30.0–36.0)
MCV: 85.8 fL (ref 78.0–100.0)
MONOS PCT: 9 %
Monocytes Absolute: 1.3 10*3/uL — ABNORMAL HIGH (ref 0.1–1.0)
NEUTROS PCT: 55 %
Neutro Abs: 8.1 10*3/uL — ABNORMAL HIGH (ref 1.7–7.7)
Platelets: 295 10*3/uL (ref 150–400)
RBC: 4.3 MIL/uL (ref 4.22–5.81)
RDW: 13.4 % (ref 11.5–15.5)
WBC: 14.7 10*3/uL — ABNORMAL HIGH (ref 4.0–10.5)

## 2016-06-12 LAB — COMPREHENSIVE METABOLIC PANEL
ALBUMIN: 4 g/dL (ref 3.5–5.0)
ALK PHOS: 56 U/L (ref 38–126)
ALT: 32 U/L (ref 17–63)
ANION GAP: 7 (ref 5–15)
AST: 21 U/L (ref 15–41)
BUN: 24 mg/dL — ABNORMAL HIGH (ref 6–20)
CALCIUM: 9.1 mg/dL (ref 8.9–10.3)
CO2: 29 mmol/L (ref 22–32)
Chloride: 102 mmol/L (ref 101–111)
Creatinine, Ser: 0.77 mg/dL (ref 0.61–1.24)
GFR calc non Af Amer: >60 mL/min (ref >60)
Glucose, Bld: 113 mg/dL — ABNORMAL HIGH (ref 65–99)
POTASSIUM: 3.1 mmol/L — AB (ref 3.5–5.1)
SODIUM: 138 mmol/L (ref 135–145)
Total Bilirubin: 0.4 mg/dL (ref 0.3–1.2)
Total Protein: 6.9 g/dL (ref 6.5–8.1)

## 2016-06-12 MED ORDER — KETOROLAC TROMETHAMINE 30 MG/ML IJ SOLN
30.0000 mg | Freq: Once | INTRAMUSCULAR | Status: AC
  Administered 2016-06-12: 30 mg via INTRAVENOUS
  Filled 2016-06-12: qty 1

## 2016-06-12 MED ORDER — SODIUM CHLORIDE 0.9 % IV BOLUS (SEPSIS)
1000.0000 mL | Freq: Once | INTRAVENOUS | Status: AC
  Administered 2016-06-12: 1000 mL via INTRAVENOUS

## 2016-06-12 MED ORDER — ACETAMINOPHEN 500 MG PO TABS
1000.0000 mg | ORAL_TABLET | Freq: Once | ORAL | Status: AC
  Administered 2016-06-12: 1000 mg via ORAL
  Filled 2016-06-12: qty 2

## 2016-06-12 MED ORDER — ONDANSETRON 8 MG PO TBDP: ORAL_TABLET | ORAL | 0 refills | Status: DC

## 2016-06-12 MED ORDER — ONDANSETRON HCL 4 MG/2ML IJ SOLN
4.0000 mg | Freq: Once | INTRAMUSCULAR | Status: AC
  Administered 2016-06-12: 4 mg via INTRAVENOUS
  Filled 2016-06-12: qty 2

## 2016-06-12 NOTE — ED Triage Notes (Signed)
States that he did not feel well on Monday. States that he testing positive for the flu on Tuesday. Was started on tamiflu. States last fever was Tuesday. States pain went back to work and also to the gym. States tonight he started vomiting times 4. C/o chills, and cough that is worse than earlier in the week. Pt is asleep on exam and will not answer questions. Wife feels that patient has over done it with going back to work and the gym.

## 2016-06-12 NOTE — ED Notes (Signed)
ED Provider at bedside. 

## 2016-06-12 NOTE — ED Provider Notes (Signed)
[facility] Provider Note   CSN: [id number] Arrival date & time: 06/12/16  0146     History   Chief Complaint Chief Complaint  Patient presents with  . Emesis    HPI Grant Woods is a 46 y.o. male.  Patient is a 46 year old male with history of depression, back pain, GERD. He presents for evaluation of "I feel terrible". He has been sick with cough, congestion, fever, headache for the past week. He was initially diagnosed with influenza and treated with Tamiflu he has been taking this all week, and is now having vomiting and diarrhea and aching all over. He has no energy.   The history is provided by the patient.  Emesis   This is a new problem. The current episode started 3 to 5 hours ago. The problem occurs continuously. The problem has been rapidly worsening. The emesis has an appearance of stomach contents. Associated symptoms include cough, diarrhea and URI. Pertinent negatives include no abdominal pain.    Past Medical History:  Diagnosis Date  . Allergy   . Back pain   . Depression   . GERD (gastroesophageal reflux disease)   . Heroin use disorder, moderate, in sustained remission (HCC)   . MRSA (methicillin resistant Staphylococcus aureus)   . Opioid use disorder, moderate, on maintenance therapy [facility](HCC)     Patient Active Problem List   Diagnosis Date Noted  . Primary osteoarthritis of right knee 06/09/2016  . Achilles tendinitis of both lower extremities 06/09/2016  . Class 1 obesity due to excess calories without serious comorbidity with body mass index (BMI) of 30.0 to 30.9 in adult 06/09/2016  . Current every day smoker 06/09/2016  . Sleep disturbance 06/09/2016  . Heroin use disorder, moderate, in sustained remission (HCC)   . Opioid use disorder, moderate, on maintenance therapy [facility](HCC)     Past Surgical History:  Procedure Laterality Date  . KNEE ARTHROSCOPY Right        Home Medications    Prior to Admission medications     Medication Sig Start Date End Date Taking? Authorizing Provider  acetaminophen (TYLENOL) 500 MG tablet Take 1,000 mg by mouth every 8 (eight) hours as needed for moderate pain.    Historical Provider, MD  albuterol (PROVENTIL HFA;VENTOLIN HFA) 108 (90 Base) MCG/ACT inhaler Inhale 2 puffs into the lungs every 6 (six) hours as needed for wheezing or shortness of breath. 06/09/16   Helane RimaErica Wallace, DO  Buprenorphine HCl-Naloxone HCl (ZUBSOLV) 5.7-1.4 MG SUBL Place 1 tablet under the tongue 2 (two) times daily. 6 AM and 4 PM    Historical Provider, MD  calcium-vitamin D (OSCAL WITH D) 500-200 MG-UNIT tablet Take 1 tablet by mouth daily with breakfast.    Historical Provider, MD  ibuprofen (ADVIL,MOTRIN) 200 MG tablet Take 800 mg by mouth every 8 (eight) hours as needed for moderate pain.    Historical Provider, MD  Multiple Vitamin (MULTIVITAMIN WITH MINERALS) TABS tablet Take 1 tablet by mouth daily.    Historical Provider, MD  POTASSIUM GLUCONATE PO Take 1 tablet by mouth daily.    Historical Provider, MD  ranitidine (ZANTAC) 75 MG tablet Take 75 mg by mouth 2 (two) times daily as needed for heartburn.    Historical Provider, MD    Family History Family History  Problem Relation Age of Onset  . Diabetes type II Mother   . Stroke Mother   . Hypertension Mother   . Diabetes type II Father     Social  History Social History  Substance Use Topics  . Smoking status: Current Every Day Smoker    Packs/day: 1.00  . Smokeless tobacco: Never Used  . Alcohol use No     Allergies   Eggs or egg-derived products and Penicillins   Review of Systems Review of Systems  Respiratory: Positive for cough.   Gastrointestinal: Positive for diarrhea and vomiting. Negative for abdominal pain.  All other systems reviewed and are negative.    Physical Exam Updated Vital Signs BP 141/96 (BP Location: Right Arm)   Pulse 60   Temp 97.8 F (36.6 C) (Oral)   Resp 18   Ht 6\' 2"  (1.88 m)   Wt 240 lb  (108.9 kg)   SpO2 98%   BMI 30.81 kg/m   Physical Exam  Constitutional: He is oriented to person, place, and time. He appears well-developed and well-nourished. No distress.  HENT:  Head: Normocephalic and atraumatic.  Mouth/Throat: Oropharynx is clear and moist.  Neck: Normal range of motion. Neck supple.  Cardiovascular: Normal rate, regular rhythm and normal heart sounds.  Exam reveals no friction rub.   No murmur heard. Pulmonary/Chest: Effort normal and breath sounds normal. No respiratory distress. He has no wheezes. He has no rales.  Abdominal: Soft. Bowel sounds are normal. He exhibits no distension. There is no tenderness.  Musculoskeletal: Normal range of motion. He exhibits no edema.  Lymphadenopathy:    He has no cervical adenopathy.  Neurological: He is alert and oriented to person, place, and time. Coordination normal.  Skin: Skin is warm and dry. He is not diaphoretic.  Nursing note and vitals reviewed.    ED Treatments / Results  Labs (all labs ordered are listed, but only abnormal results are displayed) Labs Reviewed  COMPREHENSIVE METABOLIC PANEL  CBC WITH DIFFERENTIAL/PLATELET    EKG  EKG Interpretation None       Radiology No results found.  Procedures Procedures (including critical care time)  Medications Ordered in ED Medications  sodium chloride 0.9 % bolus 1,000 mL (not administered)  ketorolac (TORADOL) 30 MG/ML injection 30 mg (not administered)  ondansetron (ZOFRAN) injection 4 mg (not administered)     Initial Impression / Assessment and Plan / ED Course  I have reviewed the triage vital signs and the nursing notes.  Pertinent labs & imaging results that were available during my care of the patient were reviewed by me and considered in my medical decision making (see chart for details).  Patient with flulike symptoms presents with generalized body aches and vomiting. He was given fluids and medications. His laboratory studies are  consistent with a viral illness and chest x-ray is clear. I see no indication for further workup. He is to follow-up with his primary Dr. if he does not improve.  Final Clinical Impressions(s) / ED Diagnoses   Final diagnoses:  None    New Prescriptions New Prescriptions   No medications on file     Geoffery Lyons, MD 06/12/16 [phone]

## 2016-06-12 NOTE — Discharge Instructions (Signed)
Zofran as prescribed as needed for nausea.  Drink plenty of fluids and get plenty of rest.  Up with your primary Dr. if not improving in the next 3-4 days, and return to the ER if your symptoms significantly worsen or change.

## 2016-06-13 ENCOUNTER — Ambulatory Visit: Payer: BLUE CROSS/BLUE SHIELD | Admitting: Sports Medicine

## 2016-06-13 DIAGNOSIS — Z0289 Encounter for other administrative examinations: Secondary | ICD-10-CM

## 2016-06-16 ENCOUNTER — Other Ambulatory Visit: Payer: BLUE CROSS/BLUE SHIELD

## 2016-06-26 ENCOUNTER — Encounter (HOSPITAL_BASED_OUTPATIENT_CLINIC_OR_DEPARTMENT_OTHER): Payer: Self-pay | Admitting: *Deleted

## 2016-06-26 ENCOUNTER — Emergency Department (HOSPITAL_BASED_OUTPATIENT_CLINIC_OR_DEPARTMENT_OTHER): Payer: BLUE CROSS/BLUE SHIELD

## 2016-06-26 ENCOUNTER — Inpatient Hospital Stay (HOSPITAL_COMMUNITY): Payer: BLUE CROSS/BLUE SHIELD

## 2016-06-26 ENCOUNTER — Inpatient Hospital Stay (HOSPITAL_BASED_OUTPATIENT_CLINIC_OR_DEPARTMENT_OTHER)
Admission: EM | Admit: 2016-06-26 | Discharge: 2016-06-29 | DRG: 418 | Disposition: A | Discharge status: Home / Self Care | Payer: BLUE CROSS/BLUE SHIELD | Attending: Nephrology | Admitting: Nephrology

## 2016-06-26 DIAGNOSIS — F17210 Nicotine dependence, cigarettes, uncomplicated: Secondary | ICD-10-CM | POA: Diagnosis not present

## 2016-06-26 DIAGNOSIS — K828 Other specified diseases of gallbladder: Secondary | ICD-10-CM | POA: Diagnosis not present

## 2016-06-26 DIAGNOSIS — Z419 Encounter for procedure for purposes other than remedying health state, unspecified: Secondary | ICD-10-CM

## 2016-06-26 DIAGNOSIS — F1120 Opioid dependence, uncomplicated: Secondary | ICD-10-CM | POA: Diagnosis present

## 2016-06-26 DIAGNOSIS — K851 Biliary acute pancreatitis without necrosis or infection: Principal | ICD-10-CM | POA: Diagnosis present

## 2016-06-26 DIAGNOSIS — R1013 Epigastric pain: Secondary | ICD-10-CM | POA: Diagnosis not present

## 2016-06-26 DIAGNOSIS — R112 Nausea with vomiting, unspecified: Secondary | ICD-10-CM | POA: Diagnosis not present

## 2016-06-26 DIAGNOSIS — Z0389 Encounter for observation for other suspected diseases and conditions ruled out: Secondary | ICD-10-CM | POA: Diagnosis not present

## 2016-06-26 DIAGNOSIS — R932 Abnormal findings on diagnostic imaging of liver and biliary tract: Secondary | ICD-10-CM | POA: Diagnosis not present

## 2016-06-26 DIAGNOSIS — R109 Unspecified abdominal pain: Secondary | ICD-10-CM | POA: Diagnosis not present

## 2016-06-26 DIAGNOSIS — K859 Acute pancreatitis without necrosis or infection, unspecified: Secondary | ICD-10-CM | POA: Diagnosis not present

## 2016-06-26 DIAGNOSIS — R935 Abnormal findings on diagnostic imaging of other abdominal regions, including retroperitoneum: Secondary | ICD-10-CM | POA: Diagnosis not present

## 2016-06-26 DIAGNOSIS — E876 Hypokalemia: Secondary | ICD-10-CM | POA: Diagnosis not present

## 2016-06-26 DIAGNOSIS — R1110 Vomiting, unspecified: Secondary | ICD-10-CM | POA: Diagnosis not present

## 2016-06-26 DIAGNOSIS — R17 Unspecified jaundice: Secondary | ICD-10-CM

## 2016-06-26 DIAGNOSIS — R197 Diarrhea, unspecified: Secondary | ICD-10-CM | POA: Diagnosis not present

## 2016-06-26 DIAGNOSIS — F329 Major depressive disorder, single episode, unspecified: Secondary | ICD-10-CM | POA: Diagnosis not present

## 2016-06-26 DIAGNOSIS — K8010 Calculus of gallbladder with chronic cholecystitis without obstruction: Secondary | ICD-10-CM | POA: Diagnosis not present

## 2016-06-26 DIAGNOSIS — F17200 Nicotine dependence, unspecified, uncomplicated: Secondary | ICD-10-CM | POA: Diagnosis present

## 2016-06-26 DIAGNOSIS — R748 Abnormal levels of other serum enzymes: Secondary | ICD-10-CM | POA: Diagnosis not present

## 2016-06-26 DIAGNOSIS — Z720 Tobacco use: Secondary | ICD-10-CM | POA: Diagnosis present

## 2016-06-26 DIAGNOSIS — K8020 Calculus of gallbladder without cholecystitis without obstruction: Secondary | ICD-10-CM

## 2016-06-26 DIAGNOSIS — K219 Gastro-esophageal reflux disease without esophagitis: Secondary | ICD-10-CM | POA: Diagnosis not present

## 2016-06-26 LAB — COMPREHENSIVE METABOLIC PANEL
ALBUMIN: 4 g/dL (ref 3.5–5.0)
ALK PHOS: 92 U/L (ref 38–126)
ALK PHOS: 94 U/L (ref 38–126)
ALT: 366 U/L — ABNORMAL HIGH (ref 17–63)
ALT: 354 U/L — ABNORMAL HIGH (ref 17–63)
ANION GAP: 5 (ref 5–15)
ANION GAP: 7 (ref 5–15)
AST: 440 U/L — ABNORMAL HIGH (ref 15–41)
AST: 253 U/L — ABNORMAL HIGH (ref 15–41)
Albumin: 3.8 g/dL (ref 3.5–5.0)
BILIRUBIN TOTAL: 1.2 mg/dL (ref 0.3–1.2)
BUN: 19 mg/dL (ref 6–20)
BUN: 13 mg/dL (ref 6–20)
CALCIUM: 8.5 mg/dL — AB (ref 8.9–10.3)
CHLORIDE: 104 mmol/L (ref 101–111)
CO2: 30 mmol/L (ref 22–32)
CO2: 24 mmol/L (ref 22–32)
Calcium: 9.1 mg/dL (ref 8.9–10.3)
Chloride: 106 mmol/L (ref 101–111)
Creatinine, Ser: 0.96 mg/dL (ref 0.61–1.24)
Creatinine, Ser: 0.81 mg/dL (ref 0.61–1.24)
GFR calc non Af Amer: >60 mL/min (ref >60)
GFR calc non Af Amer: >60 mL/min (ref >60)
Glucose, Bld: 156 mg/dL — ABNORMAL HIGH (ref 65–99)
Glucose, Bld: 105 mg/dL — ABNORMAL HIGH (ref 65–99)
POTASSIUM: 3.5 mmol/L (ref 3.5–5.1)
POTASSIUM: 3.7 mmol/L (ref 3.5–5.1)
SODIUM: 139 mmol/L (ref 135–145)
SODIUM: 137 mmol/L (ref 135–145)
TOTAL PROTEIN: 6.4 g/dL — AB (ref 6.5–8.1)
Total Bilirubin: 1.6 mg/dL — ABNORMAL HIGH (ref 0.3–1.2)
Total Protein: 7.1 g/dL (ref 6.5–8.1)

## 2016-06-26 LAB — CBC WITH DIFFERENTIAL/PLATELET
Basophils Absolute: 0 10*3/uL (ref 0.0–0.1)
Basophils Relative: 0 %
EOS ABS: 0.1 10*3/uL (ref 0.0–0.7)
Eosinophils Relative: 1 %
HEMATOCRIT: 40.9 % (ref 39.0–52.0)
HEMOGLOBIN: 13.9 g/dL (ref 13.0–17.0)
LYMPHS ABS: 1.7 10*3/uL (ref 0.7–4.0)
Lymphocytes Relative: 11 %
MCH: 29.5 pg (ref 26.0–34.0)
MCHC: 34 g/dL (ref 30.0–36.0)
MCV: 86.8 fL (ref 78.0–100.0)
Monocytes Absolute: 1.4 10*3/uL — ABNORMAL HIGH (ref 0.1–1.0)
Monocytes Relative: 9 %
NEUTROS ABS: 11.8 10*3/uL — AB (ref 1.7–7.7)
NEUTROS PCT: 79 %
Platelets: 311 10*3/uL (ref 150–400)
RBC: 4.71 MIL/uL (ref 4.22–5.81)
RDW: 14.1 % (ref 11.5–15.5)
WBC: 15.2 10*3/uL — AB (ref 4.0–10.5)

## 2016-06-26 LAB — URINALYSIS, ROUTINE W REFLEX MICROSCOPIC
Bilirubin Urine: NEGATIVE
Glucose, UA: NEGATIVE mg/dL
KETONES UR: NEGATIVE mg/dL
Leukocytes, UA: NEGATIVE
NITRITE: NEGATIVE
PROTEIN: NEGATIVE mg/dL
Specific Gravity, Urine: 1.011 (ref 1.005–1.030)
pH: 6 (ref 5.0–8.0)

## 2016-06-26 LAB — URINALYSIS, MICROSCOPIC (REFLEX)

## 2016-06-26 LAB — RAPID URINE DRUG SCREEN, HOSP PERFORMED
Amphetamines: NOT DETECTED
BARBITURATES: NOT DETECTED
BENZODIAZEPINES: NOT DETECTED
Cocaine: NOT DETECTED
Opiates: POSITIVE — AB
Tetrahydrocannabinol: NOT DETECTED

## 2016-06-26 LAB — LIPASE, BLOOD: Lipase: 4850 U/L — ABNORMAL HIGH (ref 11–51)

## 2016-06-26 LAB — CBC
HEMATOCRIT: 40.1 % (ref 39.0–52.0)
Hemoglobin: 13.4 g/dL (ref 13.0–17.0)
MCH: 28.7 pg (ref 26.0–34.0)
MCHC: 33.4 g/dL (ref 30.0–36.0)
MCV: 85.9 fL (ref 78.0–100.0)
Platelets: 276 10*3/uL (ref 150–400)
RBC: 4.67 MIL/uL (ref 4.22–5.81)
RDW: 14.1 % (ref 11.5–15.5)
WBC: 14.8 10*3/uL — ABNORMAL HIGH (ref 4.0–10.5)

## 2016-06-26 LAB — BILIRUBIN, DIRECT: BILIRUBIN DIRECT: 0.2 mg/dL (ref 0.1–0.5)

## 2016-06-26 LAB — PROTIME-INR
INR: 1
Prothrombin Time: 13.2 seconds (ref 11.4–15.2)

## 2016-06-26 LAB — LACTIC ACID, PLASMA
LACTIC ACID, VENOUS: 0.9 mmol/L (ref 0.5–1.9)
Lactic Acid, Venous: 0.9 mmol/L (ref 0.5–1.9)

## 2016-06-26 LAB — TRIGLYCERIDES: Triglycerides: 34 mg/dL (ref <150)

## 2016-06-26 LAB — LACTATE DEHYDROGENASE: LDH: 327 U/L — ABNORMAL HIGH (ref 98–192)

## 2016-06-26 MED ORDER — SODIUM CHLORIDE 0.9 % IV SOLN
INTRAVENOUS | Status: DC
  Administered 2016-06-26: 15:00:00 via INTRAVENOUS

## 2016-06-26 MED ORDER — KETOROLAC TROMETHAMINE 30 MG/ML IJ SOLN
30.0000 mg | Freq: Four times a day (QID) | INTRAMUSCULAR | Status: DC | PRN
Start: 2016-06-26 — End: 2016-06-29
  Administered 2016-06-26 – 2016-06-29 (×7): 30 mg via INTRAVENOUS
  Filled 2016-06-26 (×7): qty 1

## 2016-06-26 MED ORDER — GADOBENATE DIMEGLUMINE 529 MG/ML IV SOLN
20.0000 mL | Freq: Once | INTRAVENOUS | Status: AC | PRN
  Administered 2016-06-26: 20 mL via INTRAVENOUS

## 2016-06-26 MED ORDER — SODIUM CHLORIDE 0.9 % IV BOLUS (SEPSIS)
1000.0000 mL | Freq: Once | INTRAVENOUS | Status: AC
  Administered 2016-06-26: 1000 mL via INTRAVENOUS

## 2016-06-26 MED ORDER — LACTATED RINGERS IV SOLN
INTRAVENOUS | Status: DC
  Administered 2016-06-26 – 2016-06-29 (×8): via INTRAVENOUS

## 2016-06-26 MED ORDER — ONDANSETRON HCL 4 MG/2ML IJ SOLN
4.0000 mg | Freq: Once | INTRAMUSCULAR | Status: AC
  Administered 2016-06-26: 4 mg via INTRAVENOUS
  Filled 2016-06-26: qty 2

## 2016-06-26 MED ORDER — NICOTINE 14 MG/24HR TD PT24
14.0000 mg | MEDICATED_PATCH | Freq: Every day | TRANSDERMAL | Status: DC
  Administered 2016-06-27 – 2016-06-29 (×2): 14 mg via TRANSDERMAL
  Filled 2016-06-26 (×2): qty 1

## 2016-06-26 MED ORDER — ACETAMINOPHEN 650 MG RE SUPP: 650.0000 mg | Freq: Four times a day (QID) | RECTAL | Status: DC | PRN

## 2016-06-26 MED ORDER — BUPRENORPHINE HCL-NALOXONE HCL 8-2 MG SL SUBL: 1.0000 | SUBLINGUAL_TABLET | Freq: Every day | SUBLINGUAL | Status: DC

## 2016-06-26 MED ORDER — ACETAMINOPHEN 325 MG PO TABS: 650.0000 mg | ORAL_TABLET | Freq: Four times a day (QID) | ORAL | Status: DC | PRN

## 2016-06-26 MED ORDER — IOPAMIDOL (ISOVUE-300) INJECTION 61%
100.0000 mL | Freq: Once | INTRAVENOUS | Status: AC | PRN
  Administered 2016-06-26: 100 mL via INTRAVENOUS

## 2016-06-26 MED ORDER — HYDROMORPHONE HCL 1 MG/ML IJ SOLN
1.0000 mg | INTRAMUSCULAR | Status: DC | PRN
  Administered 2016-06-26 (×2): 1 mg via INTRAVENOUS
  Filled 2016-06-26 (×2): qty 1

## 2016-06-26 MED ORDER — PANTOPRAZOLE SODIUM 40 MG IV SOLR
40.0000 mg | Freq: Two times a day (BID) | INTRAVENOUS | Status: DC
  Administered 2016-06-26 – 2016-06-29 (×6): 40 mg via INTRAVENOUS
  Filled 2016-06-26 (×6): qty 40

## 2016-06-26 MED ORDER — HYDROMORPHONE HCL 2 MG/ML IJ SOLN
1.0000 mg | INTRAMUSCULAR | Status: DC | PRN
  Administered 2016-06-26: 1 mg via INTRAVENOUS
  Filled 2016-06-26: qty 1

## 2016-06-26 MED ORDER — SODIUM CHLORIDE 0.9% FLUSH
3.0000 mL | Freq: Two times a day (BID) | INTRAVENOUS | Status: DC
  Administered 2016-06-26 – 2016-06-29 (×3): 3 mL via INTRAVENOUS

## 2016-06-26 MED ORDER — FENTANYL CITRATE (PF) 100 MCG/2ML IJ SOLN
50.0000 ug | Freq: Four times a day (QID) | INTRAMUSCULAR | Status: DC | PRN
  Administered 2016-06-26 – 2016-06-29 (×5): 50 ug via INTRAVENOUS
  Filled 2016-06-26 (×5): qty 2

## 2016-06-26 MED ORDER — HEPARIN SODIUM (PORCINE) 5000 UNIT/ML IJ SOLN
5000.0000 [IU] | Freq: Three times a day (TID) | INTRAMUSCULAR | Status: DC
  Administered 2016-06-26 – 2016-06-27 (×2): 5000 [IU] via SUBCUTANEOUS
  Filled 2016-06-26 (×3): qty 1

## 2016-06-26 MED ORDER — NALOXONE HCL 0.4 MG/ML IJ SOLN: 0.4000 mg | INTRAMUSCULAR | Status: DC | PRN

## 2016-06-26 MED ORDER — SODIUM CHLORIDE 0.9 % IV SOLN
INTRAVENOUS | Status: DC
  Administered 2016-06-26: 09:00:00 via INTRAVENOUS

## 2016-06-26 MED ORDER — ALBUTEROL SULFATE (2.5 MG/3ML) 0.083% IN NEBU: 2.5000 mg | INHALATION_SOLUTION | Freq: Four times a day (QID) | RESPIRATORY_TRACT | Status: DC | PRN

## 2016-06-26 NOTE — ED Provider Notes (Signed)
Care assumed from Dr. Lynelle DoctorKnapp at shift change. Patient awaiting a CT scan to further evaluate the etiology of his abdominal pain. He has an elevated white count, elevated LFTs, and markedly elevated lipase. CT scan shows pancreatitis with extensive peripancreatic swelling and inflammation.  I've discussed this case with Dr. Melynda RippleHobbs who is the admitting physician [facility]. He will accept the patient in transfer.   Geoffery Lyonsouglas Coda Mathey, MD 06/26/16 [phone]

## 2016-06-26 NOTE — ED Provider Notes (Signed)
[facility] Provider Note   CSN: [id number] Arrival date & time: 06/26/16  4098     History   Chief Complaint Chief Complaint  Patient presents with  . Abdominal Pain    HPI Grant Woods is a 46 y.o. male.  HPI The patient presents to the emergency room with complaints of severe abdominal pain. Patient had some intermittent abdominal cramping this past week. This evening however he had severe onset of abdominal pain. The pain is throughout his abdomen although located primarily in the upper abdomen. Does not radiate. He feels nauseated.  He has vomited. No diarrhea. No dysuria. No history of prior abdominal pain. He drinks alcohol occasionally but not regularly. Past Medical History:  Diagnosis Date  . Allergy   . Back pain   . Depression   . GERD (gastroesophageal reflux disease)   . Heroin use disorder, moderate, in sustained remission (HCC)   . MRSA (methicillin resistant Staphylococcus aureus)   . Opioid use disorder, moderate, on maintenance therapy [facility])     Patient Active Problem List   Diagnosis Date Noted  . Primary osteoarthritis of right knee 06/09/2016  . Achilles tendinitis of both lower extremities 06/09/2016  . Class 1 obesity due to excess calories without serious comorbidity with body mass index (BMI) of 30.0 to 30.9 in adult 06/09/2016  . Current every day smoker 06/09/2016  . Sleep disturbance 06/09/2016  . Heroin use disorder, moderate, in sustained remission (HCC)   . Opioid use disorder, moderate, on maintenance therapy [facility])     Past Surgical History:  Procedure Laterality Date  . KNEE ARTHROSCOPY Right        Home Medications    Prior to Admission medications   Medication Sig Start Date End Date Taking? Authorizing Provider  acetaminophen (TYLENOL) 500 MG tablet Take 1,000 mg by mouth every 8 (eight) hours as needed for moderate pain.    Historical Provider, MD  albuterol (PROVENTIL HFA;VENTOLIN HFA) 108 (90 Base) MCG/ACT  inhaler Inhale 2 puffs into the lungs every 6 (six) hours as needed for wheezing or shortness of breath. 06/09/16   Helane Rima, DO  Buprenorphine HCl-Naloxone HCl (ZUBSOLV) 5.7-1.4 MG SUBL Place 1 tablet under the tongue 2 (two) times daily. 6 AM and 4 PM    Historical Provider, MD  calcium-vitamin D (OSCAL WITH D) 500-200 MG-UNIT tablet Take 1 tablet by mouth daily with breakfast.    Historical Provider, MD  ibuprofen (ADVIL,MOTRIN) 200 MG tablet Take 800 mg by mouth every 8 (eight) hours as needed for moderate pain.    Historical Provider, MD  Multiple Vitamin (MULTIVITAMIN WITH MINERALS) TABS tablet Take 1 tablet by mouth daily.    Historical Provider, MD  ondansetron (ZOFRAN ODT) 8 MG disintegrating tablet 8mg  ODT q4 hours prn nausea 06/12/16   Geoffery Lyons, MD  POTASSIUM GLUCONATE PO Take 1 tablet by mouth daily.    Historical Provider, MD  ranitidine (ZANTAC) 75 MG tablet Take 75 mg by mouth 2 (two) times daily as needed for heartburn.    Historical Provider, MD    Family History Family History  Problem Relation Age of Onset  . Diabetes type II Mother   . Stroke Mother   . Hypertension Mother   . Diabetes type II Father     Social History Social History  Substance Use Topics  . Smoking status: Current Every Day Smoker    Packs/day: 1.00  . Smokeless tobacco: Never Used  . Alcohol use No  Allergies   Eggs or egg-derived products and Penicillins   Review of Systems Review of Systems  All other systems reviewed and are negative.    Physical Exam Updated Vital Signs BP 123/79   Pulse (!) 54   Temp 97.5 F (36.4 C) (Oral)   Resp 14   SpO2 95%   Physical Exam  Constitutional: He appears well-developed and well-nourished. No distress.  HENT:  Head: Normocephalic and atraumatic.  Right Ear: External ear normal.  Left Ear: External ear normal.  Eyes: Conjunctivae are normal. Right eye exhibits no discharge. Left eye exhibits no discharge. No scleral icterus.    Neck: Neck supple. No tracheal deviation present.  Cardiovascular: Normal rate, regular rhythm and intact distal pulses.   Pulmonary/Chest: Effort normal and breath sounds normal. No stridor. No respiratory distress. He has no wheezes. He has no rales.  Abdominal: Soft. Bowel sounds are normal. He exhibits no distension and no mass. There is generalized tenderness. There is guarding. There is no rigidity and no rebound. No hernia.  Musculoskeletal: He exhibits no edema or tenderness.  Neurological: He is alert. He has normal strength. No cranial nerve deficit (no facial droop, extraocular movements intact, no slurred speech) or sensory deficit. He exhibits normal muscle tone. He displays no seizure activity. Coordination normal.  Skin: Skin is warm. No rash noted. He is diaphoretic.  Psychiatric: He has a normal mood and affect.  Nursing note and vitals reviewed.    ED Treatments / Results  Labs (all labs ordered are listed, but only abnormal results are displayed) Labs Reviewed  CBC WITH DIFFERENTIAL/PLATELET - Abnormal; Notable for the following:       Result Value   WBC 15.2 (*)    Neutro Abs 11.8 (*)    Monocytes Absolute 1.4 (*)    All other components within normal limits  URINALYSIS, ROUTINE W REFLEX MICROSCOPIC - Abnormal; Notable for the following:    Hgb urine dipstick SMALL (*)    All other components within normal limits  URINALYSIS, MICROSCOPIC (REFLEX) - Abnormal; Notable for the following:    Bacteria, UA RARE (*)    Squamous Epithelial / LPF 0-5 (*)    All other components within normal limits  COMPREHENSIVE METABOLIC PANEL  LIPASE, BLOOD     Radiology No results found.  Procedures Procedures (including critical care time)  Medications Ordered in ED Medications  sodium chloride 0.9 % bolus 1,000 mL (1,000 mLs Intravenous New Bag/Given 06/26/16 0636)    And  0.9 %  sodium chloride infusion (not administered)  HYDROmorphone (DILAUDID) injection 1 mg (1 mg  Intravenous Given 06/26/16 0638)  ondansetron (ZOFRAN) injection 4 mg (4 mg Intravenous Given 06/26/16 [id number])     Initial Impression / Assessment and Plan / ED Course  I have reviewed the triage vital signs and the nursing notes.  Pertinent labs & imaging results that were available during my care of the patient were reviewed by me and considered in my medical decision making (see chart for details).  Clinical Course as of Jun 27 727  Wynelle LinkSun Jun 26, 2016  0702 Pt is sleeping now.  Pain is controlled.  Will ct abdomen to evaluate further  [JK]    Clinical Course User Index [JK] Linwood DibblesJon Jerret Mcbane, MD    CT scan and labs are pending.  Dr Judd Lienelo will assume care  Final Clinical Impressions(s) / ED Diagnoses  pending   Linwood DibblesJon Kimimila Tauzin, MD 06/26/16 0730

## 2016-06-26 NOTE — ED Notes (Signed)
Asked pt for a urine sample; pt yelling at visitor and moaning. Pt yells "no"

## 2016-06-26 NOTE — H&P (Signed)
[facility] History and Physical  PRESTON GARABEDIAN ZOX:[id number] DOB: 06-08-1970 DOA: 06/26/2016  Referring physician:  PCP: PROVIDER NOT IN SYSTEM   Chief Complaint: "I'm just hurting."  HPI: Grant Woods is a 46 y.o. male  past medical history of back pain, depression, reflux, and use disorder, MRSA presents with abdominal pain. Patient is a transfer from [facility] with diagnosis of pancreatitis. History gathered from patient and his wife. Patient has had several weeks of abdominal pain associated with eating first. Patient will have a meal and then approximately 30 minutes later or so he will have excruciating pain that is debilitating to the point that he can't return to work. Patient denies any abdominal trauma. No history of intra-abdominal issues. No history of abdominal surgery. No history of gallstones. No family history of pancreatitis. Denies any abuse of NSAIDs. Patient went to the emergency room today after acute onset of severe pain with nausea and vomiting.  Patient drinks 1-2 alcoholic drinks every few months.  ED course: CT scan done showing pancreatitis. Labs consistent with pancreatitis. Hospitalist was consulted for admission. Patient given 3 L of fluid prior to leaving ED.  Review of Systems:  As per HPI otherwise 10 point review of systems negative.    Past Medical History:  Diagnosis Date  . Allergy   . Back pain   . Depression   . GERD (gastroesophageal reflux disease)   . Heroin use disorder, moderate, in sustained remission (HCC)   . MRSA (methicillin resistant Staphylococcus aureus)   . Opioid use disorder, moderate, on maintenance therapy [facility])    Past Surgical History:  Procedure Laterality Date  . KNEE ARTHROSCOPY Right    Social History:  reports that he has been smoking.  He has been smoking about 1.00 pack per day. He has never used smokeless tobacco. He reports that he does not drink alcohol or use drugs.  Allergies    Allergen Reactions  . Eggs Or Egg-Derived Products Anaphylaxis and Swelling  . Penicillins Hives    Has patient had a PCN reaction causing immediate rash, facial/tongue/throat swelling, SOB or lightheadedness with hypotension: unknown Has patient had a PCN reaction causing severe rash involving mucus membranes or skin necrosis: unknown Has patient had a PCN reaction that required hospitalization unknown Has patient had a PCN reaction occurring within the last 10 years: no If all of the above answers are "NO", then may proceed with Cephalosporin use.  Childhood allergy    Family History  Problem Relation Age of Onset  . Diabetes type II Mother   . Stroke Mother   . Hypertension Mother   . Diabetes type II Father      Prior to Admission medications   Medication Sig Start Date End Date Taking? Authorizing Provider  albuterol (PROVENTIL HFA;VENTOLIN HFA) 108 (90 Base) MCG/ACT inhaler Inhale 2 puffs into the lungs every 6 (six) hours as needed for wheezing or shortness of breath. 06/09/16  Yes Helane Rima, DO  Buprenorphine HCl-Naloxone HCl (ZUBSOLV) 5.7-1.4 MG SUBL Place 1 tablet under the tongue 2 (two) times daily. 6 AM and 4 PM   Yes Historical Provider, MD  calcium-vitamin D (OSCAL WITH D) 500-200 MG-UNIT tablet Take 1 tablet by mouth daily with breakfast.   Yes Historical Provider, MD  CREATINE PO Take 1 tablet by mouth daily as needed (supplement).   Yes Historical Provider, MD  ibuprofen (ADVIL,MOTRIN) 200 MG tablet Take 800 mg by mouth every 8 (eight) hours as needed for  moderate pain.   Yes Historical Provider, MD  Multiple Vitamin (MULTIVITAMIN WITH MINERALS) TABS tablet Take 1 tablet by mouth daily.   Yes Historical Provider, MD  pantoprazole (PROTONIX) 20 MG tablet Take 20 mg by mouth daily.   Yes Historical Provider, MD  POTASSIUM GLUCONATE PO Take 1 tablet by mouth daily.   Yes Historical Provider, MD  ranitidine (ZANTAC) 75 MG tablet Take 75 mg by mouth 2 (two) times daily  as needed for heartburn.   Yes Historical Provider, MD  ondansetron (ZOFRAN ODT) 8 MG disintegrating tablet 8mg  ODT q4 hours prn nausea Patient not taking: Reported on 06/26/2016 06/12/16   Geoffery Lyons, MD   Physical Exam: Vitals:   06/26/16 0941 06/26/16 1000 06/26/16 1124 06/26/16 1440  BP:  (!) 142/90 (!) 150/72 (!) 160/95  Pulse:  66 74 77  Resp:  12 16 16   Temp: 97.6 F (36.4 C)  98.3 F (36.8 C) 99.6 F (37.6 C)  TempSrc: Oral     SpO2:  93% 94% 94%  Weight:   108.3 kg (238 lb 12.1 oz)   Height:   6\' 2"  (1.88 m)     Wt Readings from Last 3 Encounters:  06/26/16 108.3 kg (238 lb 12.1 oz)  06/12/16 108.9 kg (240 lb)  06/09/16 108.6 kg (239 lb 6.4 oz)    General:  Appears calm and comfortable; A&Ox3, sleepy Eyes:  PERRL, EOMI, normal lids, iris ENT:  grossly normal hearing, lips & tongue Neck:  no LAD, masses or thyromegaly Cardiovascular:  RRR, no m/r/g. No LE edema.  Respiratory:  CTA bilaterally, no w/r/r. Normal respiratory effort. Abdomen:  soft, distended, diffusely tender, no rebound or gaurding Skin:  no rash or induration seen on limited exam Musculoskeletal:  grossly normal tone BUE/BLE Psychiatric:  grossly normal mood and affect, speech fluent and appropriate Neurologic:  CN 2-12 grossly intact, moves all extremities in coordinated fashion.          Labs on Admission:  Basic Metabolic Panel:  Recent Labs Lab 06/26/16 0629  NA 139  K 3.5  CL 104  CO2 30  GLUCOSE 156*  BUN 19  CREATININE 0.96  CALCIUM 9.1   Liver Function Tests:  Recent Labs Lab 06/26/16 0629  AST 440*  ALT 366*  ALKPHOS 92  BILITOT 1.6*  PROT 7.1  ALBUMIN 4.0    Recent Labs Lab 06/26/16 0629  LIPASE 4,850*   No results for input(s): AMMONIA in the last 168 hours. CBC:  Recent Labs Lab 06/26/16 0629  WBC 15.2*  NEUTROABS 11.8*  HGB 13.9  HCT 40.9  MCV 86.8  PLT 311   Cardiac Enzymes: No results for input(s): CKTOTAL, CKMB, CKMBINDEX, TROPONINI in the  last 168 hours.  BNP (last 3 results) No results for input(s): BNP in the last 8760 hours.  ProBNP (last 3 results) No results for input(s): PROBNP in the last 8760 hours.   Serum creatinine: 0.96 mg/dL 40/98/11 9147 Estimated creatinine clearance: 125.9 mL/min  CBG: No results for input(s): GLUCAP in the last 168 hours.  Radiological Exams on Admission: Ct Abdomen Pelvis W Contrast  Result Date: 06/26/2016 CLINICAL DATA:  46 year old male with acute onset abdominal pain localizing to the periumbilical region accompanied by nausea, vomiting and diarrhea. EXAM: CT ABDOMEN AND PELVIS WITH CONTRAST TECHNIQUE: Multidetector CT imaging of the abdomen and pelvis was performed using the standard protocol following bolus administration of intravenous contrast. CONTRAST:  ISOVUE-300 IOPAMIDOL (ISOVUE-300) INJECTION 61% COMPARISON:  Chest x-ray obtained  today FINDINGS: Lower chest: Mild dependent atelectasis. The visualized cardiac structures are within normal limits. Small hiatal hernia. Hepatobiliary: Normal hepatic contour and morphology. No discrete hepatic lesion. Mild intrahepatic biliary ductal dilatation. The gallbladder is decompressed. Pancreas: The pancreas is edematous in the neck, head and uncinate process. No discrete mass lesion is identified. There is extensive peripancreatic inflammatory stranding and fluid extending into the root of the mesentery, extending through the gastrosplenic ligament into the left subdiaphragmatic space and extending along the gastrohepatic ligament into the right pericolic gutter. Fluid also extends down the left pericolic gutter. Spleen: Normal in size without focal abnormality. Adrenals/Urinary Tract: Normal adrenal glands. No hydronephrosis, enhancing renal mass or nephrolithiasis. 1.7 cm water attenuation cyst in the lower pole of the left kidney. Stomach/Bowel: No evidence of bowel obstruction. No focal bowel wall thickening. Normal appendix in the right  lower quadrant. Vascular/Lymphatic: No evidence of arterial complication from acute pancreatitis. The portal and splenic veins remain widely patent. There is trace atherosclerotic calcification without evidence of stenosis. No aneurysm. Reproductive: Prostate is unremarkable. Other: No abdominal wall hernia or abnormality. No abdominopelvic ascites. Musculoskeletal: No acute fracture or aggressive appearing lytic or blastic osseous lesion. Probable remote healed right superior pubic ramus fracture. IMPRESSION: 1. Acute pancreatitis affecting the pancreatic neck, head and uncinate process with extensive surrounding inflammatory change and edematous fluid. No complicating feature at this time. 2. Minimal intrahepatic biliary ductal dilatation likely secondary to pancreatitis as above. No definite cholelithiasis or choledocholithiasis seen. Electronically Signed   By: Malachy MoanHeath  McCullough M.D.   On: 06/26/2016 08:30   Dg Chest Portable 1 View  Result Date: 06/26/2016 CLINICAL DATA:  Abdominal pain and vomiting 1 day. EXAM: PORTABLE CHEST 1 VIEW COMPARISON:  06/12/2016 FINDINGS: Lungs are hypoinflated with minimal prominence of the perihilar markings. Mild linear right base opacification likely atelectasis. No evidence of effusion. Cardiomediastinal silhouette and remainder the exam is unchanged. IMPRESSION: Hypoinflation with mild right base opacification suggesting a linear atelectasis, although cannot exclude early infection. Electronically Signed   By: Elberta Fortisaniel  Boyle M.D.   On: 06/26/2016 07:30    EKG: ordered  Assessment/Plan Principal Problem:   Pancreatitis Active Problems:   Current every day smoker   Acid reflux  Pancreatitis  IV fluids Nothing by mouth GI consult Repeat labs this afternoon Strict I's and O's Checking INR Checking triglyucerides  Elevated bilirubin Upper quadrant ultrasound History concerning for possible gallbladder dysfunction/gallstones  Opioid dependence Holding  Suboxone like med When necessary fentanyl for pain for now  Reflux IV PPI  Tobacco abuse Nicotine patch  Code Status: FULL  DVT Prophylaxis: heparin Family Communication: wife at bedside Disposition Plan: Pending Improvement  Status: inpt tele  Haydee SalterPhillip M Rutger Salton, MD Family Medicine [facility] [email or web address] Password [email or web address]

## 2016-06-26 NOTE — ED Notes (Signed)
Patient transported to CT 

## 2016-06-26 NOTE — Progress Notes (Signed)
Grant Woods is a 46 y.o. male patient admitted from ED awake, alert - oriented  X 4 - no acute distress noted.  VSS - Blood pressure (!) 150/72, pulse 74, temperature 98.3 F (36.8 C), resp. rate 16, height 6\' 2"  (1.88 m), weight 108.3 kg (238 lb 12.1 oz), SpO2 94 %.    IV in place, occlusive dsg intact without redness.  Orientation to room, and floor completed with information packet given to patient/family.  Patient declined safety video at this time.  Admission INP armband ID verified with patient/family, and in place.   SR up x 2, fall assessment complete, with patient and family able to verbalize understanding of risk associated with falls, and verbalized understanding to call nsg before up out of bed.  Call light within reach, patient able to voice, and demonstrate understanding.  Skin, clean-dry- intact without evidence of bruising, or skin tears.   No evidence of skin break down noted on exam.     Will cont to eval and treat per MD orders.  Eligah EastErin M Laterra Lubinski, RN 06/26/2016 11:44 AM

## 2016-06-26 NOTE — Plan of Care (Signed)
46 year old male with past mental history of heroin, MRSA, GERD, depression, back pain.   CC: abd pain for days  Ct Abdomen Pelvis W Contrast IMPRESSION: 1. Acute pancreatitis affecting the pancreatic neck, head and uncinate process with extensive surrounding inflammatory change and edematous fluid. No complicating feature at this time. 2. Minimal intrahepatic biliary ductal dilatation likely secondary to pancreatitis as above. No definite cholelithiasis or choledocholithiasis seen. Electronically Signed   By: Malachy MoanHeath  McCullough M.D.   On: 06/26/2016 08:30   Lab Results  Component Value Date   LIPASE 4,850 (H) 06/26/2016    Requested another 1L of fluid and LDH (to eval Ranson score)  Dx: Pancreatitis   Status: inpt tele  Haydee SalterPhillip M Jaxston Chohan MD

## 2016-06-26 NOTE — Consult Note (Signed)
Reason for Consult: Pancreatitis Referring Physician: [facility]  Horald Chestnut HPI: This is a 46 year old male with a PMH of GERD, depression, opioid addiction, and back pain admitted for acute pancreatitis.  His symptoms started four months ago and it was post prandially with nausea and vomiting.  As a result he started curtail certain eating habits.  Initially it was thought to be secondary to GERD.  One month ago, at work, he suffered with a severe bout of the pain with vomiting.  The symptoms typically started 30 minutes after PO intake.  With time his symptoms continued to progress and the pain was more intense.  During this latest episode his pain was so severe that it brought him to the ER.  His CT scan revealed an uncomplicated acute pancreatitis, mild intrahepatic and extrahepatic biliary ductal dilation, and cholelithiasis.  He drinks 1-2 times per month.  Past Medical History:  Diagnosis Date  . Allergy   . Back pain   . Depression   . GERD (gastroesophageal reflux disease)   . Heroin use disorder, moderate, in sustained remission (HCC)   . MRSA (methicillin resistant Staphylococcus aureus)   . Opioid use disorder, moderate, on maintenance therapy [facility])     Past Surgical History:  Procedure Laterality Date  . KNEE ARTHROSCOPY Right     Family History  Problem Relation Age of Onset  . Diabetes type II Mother   . Stroke Mother   . Hypertension Mother   . Diabetes type II Father     Social History:  reports that he has been smoking.  He has been smoking about 1.00 pack per day. He has never used smokeless tobacco. He reports that he does not drink alcohol or use drugs.  Allergies:  Allergies  Allergen Reactions  . Eggs Or Egg-Derived Products Anaphylaxis and Swelling  . Penicillins Hives    Has patient had a PCN reaction causing immediate rash, facial/tongue/throat swelling, SOB or lightheadedness with hypotension: unknown Has patient had a PCN reaction causing  severe rash involving mucus membranes or skin necrosis: unknown Has patient had a PCN reaction that required hospitalization unknown Has patient had a PCN reaction occurring within the last 10 years: no If all of the above answers are "NO", then may proceed with Cephalosporin use.  Childhood allergy    Medications:  Scheduled: . heparin  5,000 Units Subcutaneous Q8H  . nicotine  14 mg Transdermal Daily  . pantoprazole (PROTONIX) IV  40 mg Intravenous Q12H  . sodium chloride flush  3 mL Intravenous Q12H   Continuous: . sodium chloride 150 mL/hr at 06/26/16 1432    Results for orders placed or performed during the hospital encounter of 06/26/16 (from the past 24 hour(s))  Urinalysis, Routine w reflex microscopic     Status: Abnormal   Collection Time: 06/26/16  5:45 AM  Result Value Ref Range   Color, Urine YELLOW YELLOW   APPearance CLEAR CLEAR   Specific Gravity, Urine 1.011 1.005 - 1.030   pH 6.0 5.0 - 8.0   Glucose, UA NEGATIVE NEGATIVE mg/dL   Hgb urine dipstick SMALL (A) NEGATIVE   Bilirubin Urine NEGATIVE NEGATIVE   Ketones, ur NEGATIVE NEGATIVE mg/dL   Protein, ur NEGATIVE NEGATIVE mg/dL   Nitrite NEGATIVE NEGATIVE   Leukocytes, UA NEGATIVE NEGATIVE  Urinalysis, Microscopic (reflex)     Status: Abnormal   Collection Time: 06/26/16  5:45 AM  Result Value Ref Range   RBC / HPF 0-5 0 - 5  RBC/hpf   WBC, UA 0-5 0 - 5 WBC/hpf   Bacteria, UA RARE (A) NONE SEEN   Squamous Epithelial / LPF 0-5 (A) NONE SEEN  Comprehensive metabolic panel     Status: Abnormal   Collection Time: 06/26/16  6:29 AM  Result Value Ref Range   Sodium 139 135 - 145 mmol/L   Potassium 3.5 3.5 - 5.1 mmol/L   Chloride 104 101 - 111 mmol/L   CO2 30 22 - 32 mmol/L   Glucose, Bld 156 (H) 65 - 99 mg/dL   BUN 19 6 - 20 mg/dL   Creatinine, Ser 1.61 0.61 - 1.24 mg/dL   Calcium 9.1 8.9 - 09.6 mg/dL   Total Protein 7.1 6.5 - 8.1 g/dL   Albumin 4.0 3.5 - 5.0 g/dL   AST 045 (H) 15 - 41 U/L   ALT 366 (H)  17 - 63 U/L   Alkaline Phosphatase 92 38 - 126 U/L   Total Bilirubin 1.6 (H) 0.3 - 1.2 mg/dL   GFR calc non Af Amer >60 >60 mL/min   GFR calc Af Amer >60 >60 mL/min   Anion gap 5 5 - 15  Lipase, blood     Status: Abnormal   Collection Time: 06/26/16  6:29 AM  Result Value Ref Range   Lipase 4,850 (H) 11 - 51 U/L  CBC WITH DIFFERENTIAL     Status: Abnormal   Collection Time: 06/26/16  6:29 AM  Result Value Ref Range   WBC 15.2 (H) 4.0 - 10.5 K/uL   RBC 4.71 4.22 - 5.81 MIL/uL   Hemoglobin 13.9 13.0 - 17.0 g/dL   HCT 40.9 81.1 - 91.4 %   MCV 86.8 78.0 - 100.0 fL   MCH 29.5 26.0 - 34.0 pg   MCHC 34.0 30.0 - 36.0 g/dL   RDW 78.2 95.6 - 21.3 %   Platelets 311 150 - 400 K/uL   Neutrophils Relative % 79 %   Neutro Abs 11.8 (H) 1.7 - 7.7 K/uL   Lymphocytes Relative 11 %   Lymphs Abs 1.7 0.7 - 4.0 K/uL   Monocytes Relative 9 %   Monocytes Absolute 1.4 (H) 0.1 - 1.0 K/uL   Eosinophils Relative 1 %   Eosinophils Absolute 0.1 0.0 - 0.7 K/uL   Basophils Relative 0 %   Basophils Absolute 0.0 0.0 - 0.1 K/uL  CBC     Status: Abnormal   Collection Time: 06/26/16  2:44 PM  Result Value Ref Range   WBC 14.8 (H) 4.0 - 10.5 K/uL   RBC 4.67 4.22 - 5.81 MIL/uL   Hemoglobin 13.4 13.0 - 17.0 g/dL   HCT 08.6 57.8 - 46.9 %   MCV 85.9 78.0 - 100.0 fL   MCH 28.7 26.0 - 34.0 pg   MCHC 33.4 30.0 - 36.0 g/dL   RDW 62.9 52.8 - 41.3 %   Platelets 276 150 - 400 K/uL  Lactate dehydrogenase     Status: Abnormal   Collection Time: 06/26/16  2:44 PM  Result Value Ref Range   LDH 327 (H) 98 - 192 U/L  Comprehensive metabolic panel     Status: Abnormal   Collection Time: 06/26/16  2:44 PM  Result Value Ref Range   Sodium 137 135 - 145 mmol/L   Potassium 3.7 3.5 - 5.1 mmol/L   Chloride 106 101 - 111 mmol/L   CO2 24 22 - 32 mmol/L   Glucose, Bld 105 (H) 65 - 99 mg/dL   BUN 13 6 -  20 mg/dL   Creatinine, Ser 1.470.81 0.61 - 1.24 mg/dL   Calcium 8.5 (L) 8.9 - 10.3 mg/dL   Total Protein 6.4 (L) 6.5 - 8.1  g/dL   Albumin 3.8 3.5 - 5.0 g/dL   AST 829253 (H) 15 - 41 U/L   ALT 354 (H) 17 - 63 U/L   Alkaline Phosphatase 94 38 - 126 U/L   Total Bilirubin 1.2 0.3 - 1.2 mg/dL   GFR calc non Af Amer >60 >60 mL/min   GFR calc Af Amer >60 >60 mL/min   Anion gap 7 5 - 15  Lactic acid, plasma     Status: None   Collection Time: 06/26/16  2:44 PM  Result Value Ref Range   Lactic Acid, Venous 0.9 0.5 - 1.9 mmol/L  Urine rapid drug screen (hosp performed)     Status: Abnormal   Collection Time: 06/26/16  4:01 PM  Result Value Ref Range   Opiates POSITIVE (A) NONE DETECTED   Cocaine NONE DETECTED NONE DETECTED   Benzodiazepines NONE DETECTED NONE DETECTED   Amphetamines NONE DETECTED NONE DETECTED   Tetrahydrocannabinol NONE DETECTED NONE DETECTED   Barbiturates NONE DETECTED NONE DETECTED     Ct Abdomen Pelvis W Contrast  Result Date: 06/26/2016 CLINICAL DATA:  46 year old male with acute onset abdominal pain localizing to the periumbilical region accompanied by nausea, vomiting and diarrhea. EXAM: CT ABDOMEN AND PELVIS WITH CONTRAST TECHNIQUE: Multidetector CT imaging of the abdomen and pelvis was performed using the standard protocol following bolus administration of intravenous contrast. CONTRAST:  100mL ISOVUE-300 IOPAMIDOL (ISOVUE-300) INJECTION 61% COMPARISON:  Chest x-ray obtained today FINDINGS: Lower chest: Mild dependent atelectasis. The visualized cardiac structures are within normal limits. Small hiatal hernia. Hepatobiliary: Normal hepatic contour and morphology. No discrete hepatic lesion. Mild intrahepatic biliary ductal dilatation. The gallbladder is decompressed. Pancreas: The pancreas is edematous in the neck, head and uncinate process. No discrete mass lesion is identified. There is extensive peripancreatic inflammatory stranding and fluid extending into the root of the mesentery, extending through the gastrosplenic ligament into the left subdiaphragmatic space and extending along the  gastrohepatic ligament into the right pericolic gutter. Fluid also extends down the left pericolic gutter. Spleen: Normal in size without focal abnormality. Adrenals/Urinary Tract: Normal adrenal glands. No hydronephrosis, enhancing renal mass or nephrolithiasis. 1.7 cm water attenuation cyst in the lower pole of the left kidney. Stomach/Bowel: No evidence of bowel obstruction. No focal bowel wall thickening. Normal appendix in the right lower quadrant. Vascular/Lymphatic: No evidence of arterial complication from acute pancreatitis. The portal and splenic veins remain widely patent. There is trace atherosclerotic calcification without evidence of stenosis. No aneurysm. Reproductive: Prostate is unremarkable. Other: No abdominal wall hernia or abnormality. No abdominopelvic ascites. Musculoskeletal: No acute fracture or aggressive appearing lytic or blastic osseous lesion. Probable remote healed right superior pubic ramus fracture. IMPRESSION: 1. Acute pancreatitis affecting the pancreatic neck, head and uncinate process with extensive surrounding inflammatory change and edematous fluid. No complicating feature at this time. 2. Minimal intrahepatic biliary ductal dilatation likely secondary to pancreatitis as above. No definite cholelithiasis or choledocholithiasis seen. Electronically Signed   By: Malachy MoanHeath  McCullough M.D.   On: 06/26/2016 08:30   Dg Chest Portable 1 View  Result Date: 06/26/2016 CLINICAL DATA:  Abdominal pain and vomiting 1 day. EXAM: PORTABLE CHEST 1 VIEW COMPARISON:  06/12/2016 FINDINGS: Lungs are hypoinflated with minimal prominence of the perihilar markings. Mild linear right base opacification likely atelectasis. No evidence of effusion. Cardiomediastinal silhouette  and remainder the exam is unchanged. IMPRESSION: Hypoinflation with mild right base opacification suggesting a linear atelectasis, although cannot exclude early infection. Electronically Signed   By: Elberta Fortis M.D.   On:  06/26/2016 07:30   US Abdomen Limited Ruq  Result Date: 06/26/2016 CLINICAL DATA:  46 year old male with history of elevated bilirubin and elevated liver enzymes. EXAM: US ABDOMEN LIMITED - RIGHT UPPER QUADRANT COMPARISON:  No priors. FINDINGS: Gallbladder: Several small shadowing foci measuring up to 8 mm in diameter, compatible with multiple small gallstones. Gallbladder appears contracted around these gallstones. Gallbladder wall thickness is slightly increased at 4 mm. No pericholecystic fluid. Per report from the sonographer, the patient did not exhibit a sonographic Murphy's sign on examination. Common bile duct: Diameter: 8 mm Liver: Mild intrahepatic biliary ductal dilatation. No focal lesion identified. Within normal limits in parenchymal echogenicity. IMPRESSION: 1. Cholelithiasis without definite evidence to suggest an acute cholecystitis at this time. 2. However, there is mild intra and extrahepatic biliary ductal dilatation. This may suggest the presence of a distal ductal stone, which could account for the patient's acute pancreatitis. Further evaluation with MRI of the abdomen with and without IV gadolinium with MRCP could provide additional diagnostic information if choledocholithiasis is suspected. Electronically Signed   By: Trudie Reed M.D.   On: 06/26/2016 16:30    ROS:  As stated above in the HPI otherwise negative.  Blood pressure (!) 160/95, pulse 77, temperature 99.6 F (37.6 C), resp. rate 16, height 6\' 2"  (1.88 m), weight 108.3 kg (238 lb 12.1 oz), SpO2 94 %.    PE: Gen: Sleeping, restless HEENT:  Hand/AT, EOMI Neck: Supple, no LAD Lungs: CTA Bilaterally CV: RRR without M/G/R ABM: Soft, NTND, +BS Ext: No C/C/E  Assessment/Plan: 1) Acute gallstone pancreatitis. 2) Cholelthiasis. 3) Mild intra/extrahepatic biliary ductal dilation.   There is the possibility of a retained CBD stone, but he may also have passed a stone.  He appropriately received 3 liters of fluid  in the ER.  His HCT and BUN are normal and his wife reports that he is urinating well.    Plan: 1) Await MRCP result. 2) If negative, he needs to undergo a lap chole. 3) If positive, ERCP and then a lap chole. 4) Change NS to lactated ringers.  Amanda Steuart D 06/26/2016, 5:55 PM

## 2016-06-26 NOTE — ED Triage Notes (Signed)
Pt states that he started having abd pain last night around 10pm. One episode of vomiting yesterday. Denies any fevers but states sweating. Wife states hx of reflux and took meds for same without relief. States pain is constant and describes as a stabbing type pain. Skin color pale. Pt yelling out in pain intermittently  but eyes closed at present. No distress. Denies any hx of kidney stones or gallbladder issues.

## 2016-06-27 DIAGNOSIS — R17 Unspecified jaundice: Secondary | ICD-10-CM

## 2016-06-27 LAB — COMPREHENSIVE METABOLIC PANEL
ALT: 237 U/L — ABNORMAL HIGH (ref 17–63)
ANION GAP: 10 (ref 5–15)
AST: 104 U/L — AB (ref 15–41)
Albumin: 3.5 g/dL (ref 3.5–5.0)
Alkaline Phosphatase: 77 U/L (ref 38–126)
BUN: 14 mg/dL (ref 6–20)
CHLORIDE: 104 mmol/L (ref 101–111)
CO2: 26 mmol/L (ref 22–32)
Calcium: 8.7 mg/dL — ABNORMAL LOW (ref 8.9–10.3)
Creatinine, Ser: 0.87 mg/dL (ref 0.61–1.24)
Glucose, Bld: 114 mg/dL — ABNORMAL HIGH (ref 65–99)
POTASSIUM: 3.4 mmol/L — AB (ref 3.5–5.1)
Sodium: 140 mmol/L (ref 135–145)
Total Bilirubin: 0.9 mg/dL (ref 0.3–1.2)
Total Protein: 5.9 g/dL — ABNORMAL LOW (ref 6.5–8.1)

## 2016-06-27 LAB — HIV ANTIBODY (ROUTINE TESTING W REFLEX): HIV Screen 4th Generation wRfx: NONREACTIVE

## 2016-06-27 LAB — CBC
HCT: 37.1 % — ABNORMAL LOW (ref 39.0–52.0)
Hemoglobin: 12.5 g/dL — ABNORMAL LOW (ref 13.0–17.0)
MCH: 28.9 pg (ref 26.0–34.0)
MCHC: 33.7 g/dL (ref 30.0–36.0)
MCV: 85.9 fL (ref 78.0–100.0)
PLATELETS: 253 10*3/uL (ref 150–400)
RBC: 4.32 MIL/uL (ref 4.22–5.81)
RDW: 14.3 % (ref 11.5–15.5)
WBC: 18 10*3/uL — ABNORMAL HIGH (ref 4.0–10.5)

## 2016-06-27 MED ORDER — POTASSIUM CHLORIDE 20 MEQ PO PACK
40.0000 meq | PACK | Freq: Once | ORAL | Status: AC
  Administered 2016-06-27: 40 meq via ORAL
  Filled 2016-06-27: qty 2

## 2016-06-27 MED ORDER — KCL IN DEXTROSE-NACL 20-5-0.9 MEQ/L-%-% IV SOLN
INTRAVENOUS | Status: DC
  Filled 2016-06-27: qty 1000

## 2016-06-27 MED ORDER — OXYCODONE HCL 5 MG PO TABS
10.0000 mg | ORAL_TABLET | Freq: Three times a day (TID) | ORAL | Status: DC
  Administered 2016-06-27: 10 mg via ORAL
  Filled 2016-06-27: qty 2

## 2016-06-27 MED ORDER — SODIUM CHLORIDE 0.9 % IV SOLN
10.0000 mg | Freq: Two times a day (BID) | INTRAVENOUS | Status: DC
  Administered 2016-06-27 – 2016-06-29 (×4): 10 mg via INTRAVENOUS
  Filled 2016-06-27 (×5): qty 1

## 2016-06-27 MED ORDER — ENOXAPARIN SODIUM 40 MG/0.4ML ~~LOC~~ SOLN
40.0000 mg | Freq: Every day | SUBCUTANEOUS | Status: DC
  Administered 2016-06-27: 40 mg via SUBCUTANEOUS
  Filled 2016-06-27 (×2): qty 0.4

## 2016-06-27 MED ORDER — OXYCODONE HCL 5 MG PO TABS
10.0000 mg | ORAL_TABLET | Freq: Three times a day (TID) | ORAL | Status: DC
  Administered 2016-06-27 – 2016-06-29 (×4): 10 mg via ORAL
  Filled 2016-06-27 (×4): qty 2

## 2016-06-27 NOTE — Progress Notes (Signed)
PROGRESS NOTE    Grant ChestnutWilliam E Woods  RUE:[id number]:[id number] DOB: [birth year] DOA: 06/26/2016 PCP: PROVIDER NOT IN SYSTEM   Brief Narrative: 46 y.o. male  past medical history of back pain, depression, substance use disorder on chronic Zubsolv  presents with abdominal pain. [facility]. ED course: CT scan done showing pancreatitis. Labs consistent with pancreatitis. Hospitalist was consulted for admission. Patient given 3 L of fluid prior to leaving ED.  Assessment & Plan:  # Acute gallstone pancreatitis, cholelithiasis, mild intra-/extrahepatic biliary dilatation: lipase 4850 on admission. -Already evaluated by gastroenterologist. Status post MRCP with cholelithiasis. I discussed with Dr. Elnoria HowardHung today, start soft diet. He recommended surgery consult. I discussed with surgery team for the consult.  -Liver enzymes are trending down. -Patient denied abdominal pain. -Follow up with GI and surgery  further recommendation -continue IV fluid, Pepcid and supportive care.  # History of polysubstance abuse on chronic Zubsolv: I discussed with the pharmacist. Unable to order pain medications while on Zubsolv. If patient has to undergo surgical intervention may be not able to give opiates to control pain while on Zubsolv. I reviewed this with the patient at bedside. He agreed with holding Zubsolv until further plan from GI and surgery team. Started on oxycodone 10 mg every 8 hourly to prevent opiates withdrawal. Continue to monitor clinically.  #Tobacco dependence: Continue nicotine patch  # mild hypokalemia: monitor labs. He was npo. Replete KCL   Principal Problem:   Pancreatitis Active Problems:   Current every day smoker   Acid reflux  DVT prophylaxis: lovenox sq. Code Status:full Family Communication:none Disposition Plan:likely dc home in 1-2 days. Awaiting GI and surgery's plan     Consultants:     gastroenterologist  General surgery   Procedures: MRCP  Antimicrobials: none   Subjective:  patient was seen and examined at bedside. He reported doing better. Denied headache, dizziness, nausea, vomiting or abdominal pain.  Objective: Vitals:   06/26/16 1124 06/26/16 1440 06/26/16 2224 06/27/16 0527  BP: (!) 150/72 (!) 160/95 136/81 138/77  Pulse: 74 77 (!) 105 83  Resp: 16 16 18 18   Temp: 98.3 F (36.8 C) 99.6 F (37.6 C) 98.8 F (37.1 C) 98.7 F (37.1 C)  TempSrc:   Oral Oral  SpO2: 94% 94% 92% 93%  Weight: 108.3 kg (238 lb 12.1 oz)   108.4 kg (239 lb)  Height: 6\' 2"  (1.88 m)       Intake/Output Summary (Last 24 hours) at 06/27/16 1218 Last data filed at 06/27/16 [id number]  Gross per 24 hour  Intake           1687.5 ml  Output                0 ml  Net           1687.5 ml   Filed Weights   06/26/16 1124 06/27/16 0527  Weight: 108.3 kg (238 lb 12.1 oz) 108.4 kg (239 lb)    Examination:  General exam: Appears calm and comfortable  Respiratory system: Clear to auscultation. Respiratory effort normal. No wheezing or crackle Cardiovascular system: S1 & S2 heard, RRR.  No pedal edema. Gastrointestinal system: Abdomen is nondistended, soft and nontender. Normal bowel sounds heard. Central nervous system: Alert and oriented. No focal neurological deficits. Extremities: Symmetric 5 x 5 power. Skin: No rashes, lesions or ulcers Psychiatry: Judgement and insight appear normal. Mood & affect appropriate.     Data Reviewed:  I have personally reviewed following labs and imaging studies  CBC:  Recent Labs Lab 06/26/16 0629 06/26/16 1444 06/27/16 0427  WBC 15.2* 14.8* 18.0*  NEUTROABS 11.8*  --   --   HGB 13.9 13.4 12.5*  HCT 40.9 40.1 37.1*  MCV 86.8 85.9 85.9  PLT 311 276 253   Basic Metabolic Panel:  Recent Labs Lab 06/26/16 0629 06/26/16 1444 06/27/16 0427  NA 139 137 140  K 3.5 3.7 3.4*  CL 104 106 104  CO2 30 24 26   GLUCOSE 156* 105* 114*  BUN 19 13  14   CREATININE 0.96 0.81 0.87  CALCIUM 9.1 8.5* 8.7*   GFR: Estimated Creatinine Clearance: 139.1 mL/min (by C-G formula based on SCr of 0.87 mg/dL). Liver Function Tests:  Recent Labs Lab 06/26/16 0629 06/26/16 1444 06/27/16 0427  AST 440* 253* 104*  ALT 366* 354* 237*  ALKPHOS 92 94 77  BILITOT 1.6* 1.2 0.9  PROT 7.1 6.4* 5.9*  ALBUMIN 4.0 3.8 3.5    Recent Labs Lab 06/26/16 0629  LIPASE 4,850*   No results for input(s): AMMONIA in the last 168 hours. Coagulation Profile:  Recent Labs Lab 06/26/16 1747  INR 1.00   Cardiac Enzymes: No results for input(s): CKTOTAL, CKMB, CKMBINDEX, TROPONINI in the last 168 hours. BNP (last 3 results) No results for input(s): PROBNP in the last 8760 hours. HbA1C: No results for input(s): HGBA1C in the last 72 hours. CBG: No results for input(s): GLUCAP in the last 168 hours. Lipid Profile:  Recent Labs  06/26/16 1747  TRIG 34   Thyroid Function Tests: No results for input(s): TSH, T4TOTAL, FREET4, T3FREE, THYROIDAB in the last 72 hours. Anemia Panel: No results for input(s): VITAMINB12, FOLATE, FERRITIN, TIBC, IRON, RETICCTPCT in the last 72 hours. Sepsis Labs:  Recent Labs Lab 06/26/16 1444 06/26/16 1747  LATICACIDVEN 0.9 0.9    No results found for this or any previous visit (from the past 240 hour(s)).       Radiology Studies: Ct Abdomen Pelvis W Contrast  Result Date: 06/26/2016 CLINICAL DATA:  46 year old male with acute onset abdominal pain localizing to the periumbilical region accompanied by nausea, vomiting and diarrhea. EXAM: CT ABDOMEN AND PELVIS WITH CONTRAST TECHNIQUE: Multidetector CT imaging of the abdomen and pelvis was performed using the standard protocol following bolus administration of intravenous contrast. CONTRAST:  ISOVUE-300 IOPAMIDOL (ISOVUE-300) INJECTION 61% COMPARISON:  Chest x-ray obtained today FINDINGS: Lower chest: Mild dependent atelectasis. The visualized cardiac  structures are within normal limits. Small hiatal hernia. Hepatobiliary: Normal hepatic contour and morphology. No discrete hepatic lesion. Mild intrahepatic biliary ductal dilatation. The gallbladder is decompressed. Pancreas: The pancreas is edematous in the neck, head and uncinate process. No discrete mass lesion is identified. There is extensive peripancreatic inflammatory stranding and fluid extending into the root of the mesentery, extending through the gastrosplenic ligament into the left subdiaphragmatic space and extending along the gastrohepatic ligament into the right pericolic gutter. Fluid also extends down the left pericolic gutter. Spleen: Normal in size without focal abnormality. Adrenals/Urinary Tract: Normal adrenal glands. No hydronephrosis, enhancing renal mass or nephrolithiasis. 1.7 cm water attenuation cyst in the lower pole of the left kidney. Stomach/Bowel: No evidence of bowel obstruction. No focal bowel wall thickening. Normal appendix in the right lower quadrant. Vascular/Lymphatic: No evidence of arterial complication from acute pancreatitis. The portal and splenic veins remain widely patent. There is trace atherosclerotic calcification without evidence of stenosis. No aneurysm. Reproductive: Prostate is unremarkable. Other: No abdominal  wall hernia or abnormality. No abdominopelvic ascites. Musculoskeletal: No acute fracture or aggressive appearing lytic or blastic osseous lesion. Probable remote healed right superior pubic ramus fracture. IMPRESSION: 1. Acute pancreatitis affecting the pancreatic neck, head and uncinate process with extensive surrounding inflammatory change and edematous fluid. No complicating feature at this time. 2. Minimal intrahepatic biliary ductal dilatation likely secondary to pancreatitis as above. No definite cholelithiasis or choledocholithiasis seen. Electronically Signed   By: Malachy Moan M.D.   On: 06/26/2016 08:30   Mr 3d Recon At  Scanner  Result Date: 06/26/2016 CLINICAL DATA:  Acute pancreatitis EXAM: MRI ABDOMEN WITHOUT AND WITH CONTRAST (INCLUDING MRCP) TECHNIQUE: Multiplanar multisequence MR imaging of the abdomen was performed both before and after the administration of intravenous contrast. Heavily T2-weighted images of the biliary and pancreatic ducts were obtained, and three-dimensional MRCP images were rendered by post processing. CONTRAST:  20mL MULTIHANCE GADOBENATE DIMEGLUMINE 529 MG/ML IV SOLN COMPARISON:  Ultrasound 06/26/2016, CT 06/26/2016 FINDINGS: Lower chest:  Lung bases are clear. Hepatobiliary: No intrahepatic duct dilatation. No focal hepatic lesion. The gallbladder contracted around multiple calculi entirely filling the lumen. The cystic duct appears normal. The common bile duct is mildly dilated 7 mm. There are no discrete filling defects within the common bile duct. Pancreas: The pancreas is edematous. The pancreatic duct is not dilated or interrupted. There is fluid extending along the LEFT and RIGHT anterior perirenal fascia and Morrison's pouch an dalong the pericolic gutters. No organized fluid collections. Pancreas enhances uniformly on the more delayed contrast series. Spleen: Normal spleen. Adrenals/urinary tract: Adrenal glands and kidneys are normal. Stomach/Bowel: Stomach and limited of the small bowel is unremarkable. Mild distention of small bowel likely represents ileus. Vascular/Lymphatic: Abdominal aortic normal caliber. No retroperitoneal periportal lymphadenopathy. Musculoskeletal: No aggressive osseous lesion IMPRESSION: 1. Multiple gallstones packed within the contracted gallbladder. 2. Common bile duct is mildly dilated but there is no evidence of filling defect to suggest choledocholithiasis. 3. No significant intrahepatic duct dilatation. 4. Pancreatic duct is normal caliber. Pancreatic edema and peripancreatic fluid. No evidence of pancreatic ductal interruption. Pancreatic parenchyma  enhances relatively uniformly. Electronically Signed   By: Genevive Bi M.D.   On: 06/26/2016 23:31   Dg Chest Portable 1 View  Result Date: 06/26/2016 CLINICAL DATA:  Abdominal pain and vomiting 1 day. EXAM: PORTABLE CHEST 1 VIEW COMPARISON:  06/12/2016 FINDINGS: Lungs are hypoinflated with minimal prominence of the perihilar markings. Mild linear right base opacification likely atelectasis. No evidence of effusion. Cardiomediastinal silhouette and remainder the exam is unchanged. IMPRESSION: Hypoinflation with mild right base opacification suggesting a linear atelectasis, although cannot exclude early infection. Electronically Signed   By: Elberta Fortis M.D.   On: 06/26/2016 07:30   Mr Abdomen Mrcp Vivien Rossetti Contast  Result Date: 06/26/2016 CLINICAL DATA:  Acute pancreatitis EXAM: MRI ABDOMEN WITHOUT AND WITH CONTRAST (INCLUDING MRCP) TECHNIQUE: Multiplanar multisequence MR imaging of the abdomen was performed both before and after the administration of intravenous contrast. Heavily T2-weighted images of the biliary and pancreatic ducts were obtained, and three-dimensional MRCP images were rendered by post processing. CONTRAST:  20mL MULTIHANCE GADOBENATE DIMEGLUMINE 529 MG/ML IV SOLN COMPARISON:  Ultrasound 06/26/2016, CT 06/26/2016 FINDINGS: Lower chest:  Lung bases are clear. Hepatobiliary: No intrahepatic duct dilatation. No focal hepatic lesion. The gallbladder contracted around multiple calculi entirely filling the lumen. The cystic duct appears normal. The common bile duct is mildly dilated 7 mm. There are no discrete filling defects within the common bile duct. Pancreas: The  pancreas is edematous. The pancreatic duct is not dilated or interrupted. There is fluid extending along the LEFT and RIGHT anterior perirenal fascia and Morrison's pouch an dalong the pericolic gutters. No organized fluid collections. Pancreas enhances uniformly on the more delayed contrast series. Spleen: Normal spleen.  Adrenals/urinary tract: Adrenal glands and kidneys are normal. Stomach/Bowel: Stomach and limited of the small bowel is unremarkable. Mild distention of small bowel likely represents ileus. Vascular/Lymphatic: Abdominal aortic normal caliber. No retroperitoneal periportal lymphadenopathy. Musculoskeletal: No aggressive osseous lesion IMPRESSION: 1. Multiple gallstones packed within the contracted gallbladder. 2. Common bile duct is mildly dilated but there is no evidence of filling defect to suggest choledocholithiasis. 3. No significant intrahepatic duct dilatation. 4. Pancreatic duct is normal caliber. Pancreatic edema and peripancreatic fluid. No evidence of pancreatic ductal interruption. Pancreatic parenchyma enhances relatively uniformly. Electronically Signed   By: Genevive Bi M.D.   On: 06/26/2016 23:31   US Abdomen Limited Ruq  Result Date: 06/26/2016 CLINICAL DATA:  46 year old male with history of elevated bilirubin and elevated liver enzymes. EXAM: US ABDOMEN LIMITED - RIGHT UPPER QUADRANT COMPARISON:  No priors. FINDINGS: Gallbladder: Several small shadowing foci measuring up to 8 mm in diameter, compatible with multiple small gallstones. Gallbladder appears contracted around these gallstones. Gallbladder wall thickness is slightly increased at 4 mm. No pericholecystic fluid. Per report from the sonographer, the patient did not exhibit a sonographic Murphy's sign on examination. Common bile duct: Diameter: 8 mm Liver: Mild intrahepatic biliary ductal dilatation. No focal lesion identified. Within normal limits in parenchymal echogenicity. IMPRESSION: 1. Cholelithiasis without definite evidence to suggest an acute cholecystitis at this time. 2. However, there is mild intra and extrahepatic biliary ductal dilatation. This may suggest the presence of a distal ductal stone, which could account for the patient's acute pancreatitis. Further evaluation with MRI of the abdomen with and without IV  gadolinium with MRCP could provide additional diagnostic information if choledocholithiasis is suspected. Electronically Signed   By: Trudie Reed M.D.   On: 06/26/2016 16:30        Scheduled Meds: . famotidine (PEPCID) IV  10 mg Intravenous Q12H  . heparin  5,000 Units Subcutaneous Q8H  . nicotine  14 mg Transdermal Daily  . oxyCODONE  10 mg Oral Q8H  . pantoprazole (PROTONIX) IV  40 mg Intravenous Q12H  . sodium chloride flush  3 mL Intravenous Q12H   Continuous Infusions: . lactated ringers 150 mL/hr at 06/27/16 1114     LOS: 1 day    Simmie Garin Jaynie Collins, MD [facility] Pager [phone]  If 7PM-7AM, please contact night-coverage [email or web address] Password [facility] 06/27/2016, 12:18 PM

## 2016-06-27 NOTE — Progress Notes (Signed)
Subjective: Sleeping.  Objective: Vital signs in last 24 hours: Temp:  [98.7 F (37.1 C)-99.6 F (37.6 C)] 98.7 F (37.1 C) (03/19 0527) Pulse Rate:  [77-105] 83 (03/19 0527) Resp:  [16-18] 18 (03/19 0527) BP: (136-160)/(77-95) 138/77 (03/19 0527) SpO2:  [92 %-94 %] 93 % (03/19 0527) Weight:  [108.4 kg (239 lb)] 108.4 kg (239 lb) (03/19 0527) Last BM Date: 06/25/16  Intake/Output from previous day: 03/18 0701 - 03/19 0700 In: 2687.5 [I.V.:1687.5; IV Piggyback:1000] Out: 450 [Urine:450] Intake/Output this shift: No intake/output data recorded.  General appearance: sleeping GI: Not performed  Lab Results:  Recent Labs  06/26/16 0629 06/26/16 1444 06/27/16 0427  WBC 15.2* 14.8* 18.0*  HGB 13.9 13.4 12.5*  HCT 40.9 40.1 37.1*  PLT 311 276 253   BMET  Recent Labs  06/26/16 0629 06/26/16 1444 06/27/16 0427  NA 139 137 140  K 3.5 3.7 3.4*  CL 104 106 104  CO2 30 24 26   GLUCOSE 156* 105* 114*  BUN 19 13 14   CREATININE 0.96 0.81 0.87  CALCIUM 9.1 8.5* 8.7*   LFT  Recent Labs  06/26/16 1747 06/27/16 0427  PROT  --  5.9*  ALBUMIN  --  3.5  AST  --  104*  ALT  --  237*  ALKPHOS  --  77  BILITOT  --  0.9  BILIDIR 0.2  --    PT/INR  Recent Labs  06/26/16 1747  LABPROT 13.2  INR 1.00   Hepatitis Panel No results for input(s): HEPBSAG, HCVAB, HEPAIGM, HEPBIGM in the last 72 hours. C-Diff No results for input(s): CDIFFTOX in the last 72 hours. Fecal Lactopherrin No results for input(s): FECLLACTOFRN in the last 72 hours.  Studies/Results: Ct Abdomen Pelvis W Contrast  Result Date: 06/26/2016 CLINICAL DATA:  46 year old male with acute onset abdominal pain localizing to the periumbilical region accompanied by nausea, vomiting and diarrhea. EXAM: CT ABDOMEN AND PELVIS WITH CONTRAST TECHNIQUE: Multidetector CT imaging of the abdomen and pelvis was performed using the standard protocol following bolus administration of intravenous contrast. CONTRAST:   ISOVUE-300 IOPAMIDOL (ISOVUE-300) INJECTION 61% COMPARISON:  Chest x-ray obtained today FINDINGS: Lower chest: Mild dependent atelectasis. The visualized cardiac structures are within normal limits. Small hiatal hernia. Hepatobiliary: Normal hepatic contour and morphology. No discrete hepatic lesion. Mild intrahepatic biliary ductal dilatation. The gallbladder is decompressed. Pancreas: The pancreas is edematous in the neck, head and uncinate process. No discrete mass lesion is identified. There is extensive peripancreatic inflammatory stranding and fluid extending into the root of the mesentery, extending through the gastrosplenic ligament into the left subdiaphragmatic space and extending along the gastrohepatic ligament into the right pericolic gutter. Fluid also extends down the left pericolic gutter. Spleen: Normal in size without focal abnormality. Adrenals/Urinary Tract: Normal adrenal glands. No hydronephrosis, enhancing renal mass or nephrolithiasis. 1.7 cm water attenuation cyst in the lower pole of the left kidney. Stomach/Bowel: No evidence of bowel obstruction. No focal bowel wall thickening. Normal appendix in the right lower quadrant. Vascular/Lymphatic: No evidence of arterial complication from acute pancreatitis. The portal and splenic veins remain widely patent. There is trace atherosclerotic calcification without evidence of stenosis. No aneurysm. Reproductive: Prostate is unremarkable. Other: No abdominal wall hernia or abnormality. No abdominopelvic ascites. Musculoskeletal: No acute fracture or aggressive appearing lytic or blastic osseous lesion. Probable remote healed right superior pubic ramus fracture. IMPRESSION: 1. Acute pancreatitis affecting the pancreatic neck, head and uncinate process with extensive surrounding inflammatory change and edematous fluid. No  complicating feature at this time. 2. Minimal intrahepatic biliary ductal dilatation likely secondary to pancreatitis as  above. No definite cholelithiasis or choledocholithiasis seen. Electronically Signed   By: Malachy Moan M.D.   On: 06/26/2016 08:30   Mr 3d Recon At Scanner  Result Date: 06/26/2016 CLINICAL DATA:  Acute pancreatitis EXAM: MRI ABDOMEN WITHOUT AND WITH CONTRAST (INCLUDING MRCP) TECHNIQUE: Multiplanar multisequence MR imaging of the abdomen was performed both before and after the administration of intravenous contrast. Heavily T2-weighted images of the biliary and pancreatic ducts were obtained, and three-dimensional MRCP images were rendered by post processing. CONTRAST:  20mL MULTIHANCE GADOBENATE DIMEGLUMINE 529 MG/ML IV SOLN COMPARISON:  Ultrasound 06/26/2016, CT 06/26/2016 FINDINGS: Lower chest:  Lung bases are clear. Hepatobiliary: No intrahepatic duct dilatation. No focal hepatic lesion. The gallbladder contracted around multiple calculi entirely filling the lumen. The cystic duct appears normal. The common bile duct is mildly dilated 7 mm. There are no discrete filling defects within the common bile duct. Pancreas: The pancreas is edematous. The pancreatic duct is not dilated or interrupted. There is fluid extending along the LEFT and RIGHT anterior perirenal fascia and Morrison's pouch an dalong the pericolic gutters. No organized fluid collections. Pancreas enhances uniformly on the more delayed contrast series. Spleen: Normal spleen. Adrenals/urinary tract: Adrenal glands and kidneys are normal. Stomach/Bowel: Stomach and limited of the small bowel is unremarkable. Mild distention of small bowel likely represents ileus. Vascular/Lymphatic: Abdominal aortic normal caliber. No retroperitoneal periportal lymphadenopathy. Musculoskeletal: No aggressive osseous lesion IMPRESSION: 1. Multiple gallstones packed within the contracted gallbladder. 2. Common bile duct is mildly dilated but there is no evidence of filling defect to suggest choledocholithiasis. 3. No significant intrahepatic duct dilatation.  4. Pancreatic duct is normal caliber. Pancreatic edema and peripancreatic fluid. No evidence of pancreatic ductal interruption. Pancreatic parenchyma enhances relatively uniformly. Electronically Signed   By: Genevive Bi M.D.   On: 06/26/2016 23:31   Dg Chest Portable 1 View  Result Date: 06/26/2016 CLINICAL DATA:  Abdominal pain and vomiting 1 day. EXAM: PORTABLE CHEST 1 VIEW COMPARISON:  06/12/2016 FINDINGS: Lungs are hypoinflated with minimal prominence of the perihilar markings. Mild linear right base opacification likely atelectasis. No evidence of effusion. Cardiomediastinal silhouette and remainder the exam is unchanged. IMPRESSION: Hypoinflation with mild right base opacification suggesting a linear atelectasis, although cannot exclude early infection. Electronically Signed   By: Elberta Fortis M.D.   On: 06/26/2016 07:30   Mr Abdomen Mrcp Vivien Rossetti Contast  Result Date: 06/26/2016 CLINICAL DATA:  Acute pancreatitis EXAM: MRI ABDOMEN WITHOUT AND WITH CONTRAST (INCLUDING MRCP) TECHNIQUE: Multiplanar multisequence MR imaging of the abdomen was performed both before and after the administration of intravenous contrast. Heavily T2-weighted images of the biliary and pancreatic ducts were obtained, and three-dimensional MRCP images were rendered by post processing. CONTRAST:  20mL MULTIHANCE GADOBENATE DIMEGLUMINE 529 MG/ML IV SOLN COMPARISON:  Ultrasound 06/26/2016, CT 06/26/2016 FINDINGS: Lower chest:  Lung bases are clear. Hepatobiliary: No intrahepatic duct dilatation. No focal hepatic lesion. The gallbladder contracted around multiple calculi entirely filling the lumen. The cystic duct appears normal. The common bile duct is mildly dilated 7 mm. There are no discrete filling defects within the common bile duct. Pancreas: The pancreas is edematous. The pancreatic duct is not dilated or interrupted. There is fluid extending along the LEFT and RIGHT anterior perirenal fascia and Morrison's pouch an dalong  the pericolic gutters. No organized fluid collections. Pancreas enhances uniformly on the more delayed contrast series. Spleen: Normal spleen.  Adrenals/urinary tract: Adrenal glands and kidneys are normal. Stomach/Bowel: Stomach and limited of the small bowel is unremarkable. Mild distention of small bowel likely represents ileus. Vascular/Lymphatic: Abdominal aortic normal caliber. No retroperitoneal periportal lymphadenopathy. Musculoskeletal: No aggressive osseous lesion IMPRESSION: 1. Multiple gallstones packed within the contracted gallbladder. 2. Common bile duct is mildly dilated but there is no evidence of filling defect to suggest choledocholithiasis. 3. No significant intrahepatic duct dilatation. 4. Pancreatic duct is normal caliber. Pancreatic edema and peripancreatic fluid. No evidence of pancreatic ductal interruption. Pancreatic parenchyma enhances relatively uniformly. Electronically Signed   By: Genevive BiStewart  Edmunds M.D.   On: 06/26/2016 23:31   [place] Abdomen Limited Ruq  Result Date: 06/26/2016 CLINICAL DATA:  46 year old male with history of elevated bilirubin and elevated liver enzymes. EXAM: US ABDOMEN LIMITED - RIGHT UPPER QUADRANT COMPARISON:  No priors. FINDINGS: Gallbladder: Several small shadowing foci measuring up to 8 mm in diameter, compatible with multiple small gallstones. Gallbladder appears contracted around these gallstones. Gallbladder wall thickness is slightly increased at 4 mm. No pericholecystic fluid. Per report from the sonographer, the patient did not exhibit a sonographic Murphy's sign on examination. Common bile duct: Diameter: 8 mm Liver: Mild intrahepatic biliary ductal dilatation. No focal lesion identified. Within normal limits in parenchymal echogenicity. IMPRESSION: 1. Cholelithiasis without definite evidence to suggest an acute cholecystitis at this time. 2. However, there is mild intra and extrahepatic biliary ductal dilatation. This may suggest the presence of a  distal ductal stone, which could account for the patient's acute pancreatitis. Further evaluation with MRI of the abdomen with and without IV gadolinium with MRCP could provide additional diagnostic information if choledocholithiasis is suspected. Electronically Signed   By: Trudie Reedaniel  Entrikin M.D.   On: 06/26/2016 16:30    Medications:  Scheduled: . famotidine (PEPCID) IV  10 mg Intravenous Q12H  . heparin  5,000 Units Subcutaneous Q8H  . nicotine  14 mg Transdermal Daily  . oxyCODONE  10 mg Oral Q8H  . pantoprazole (PROTONIX) IV  40 mg Intravenous Q12H  . sodium chloride flush  3 mL Intravenous Q12H   Continuous: . lactated ringers 150 mL/hr at 06/27/16 1114    Assessment/Plan: 1) Gallstone pancreatitis. 2) ABM pain - improved.   His wife reports that he is better and desired some PO intake.  I think it is fine to try eating slowly if he does not have pain.  Studies do not show any benefit with advancing from a clear liquid diet versus starting on a regular diet.  The MRCP is negative for any stones in the CBD.  The patient passed stones from the CBD.  No evidence of pancreatic necrosis.  Plan: 1) Surgical consultation for cholelithiasis. 2) Continue with LR. 3) No further GI evaluation.  Signing off.  LOS: 1 day   Jaelle Campanile D 06/27/2016, 12:24 PM

## 2016-06-27 NOTE — Consult Note (Signed)
[facility] Surgery Consult Note  Grant Woods Jul 29, 1970  [id number].    Requesting MD: Carolin Sicks, MD  Chief Complaint/Reason for Consult: acute gallstone pancreatitis  HPI:  46 year old male with a medical history significant for GERD, depression, opioid addiction, and back pain admitted 06/26/16 with a diagnosis of acute pancreatitis. Patient reports months of postprandial abdominal pain associated with nausea and vomiting. Patient states the pain has become progressively worse and brought him to the emergency room yesterday. He denies a history of previous pancreatitis, abdominal surgeries, or known cholelithiasis. States that he drinks 1-2 alcoholic beverages per month. He smokes cigarettes. Denies use of blood thinning medications.  ED workup significant for Lipase 4,850, WBC 15.2, AST 40, ALT 366, total bilirubin 1.6, and CT scan consistent with acute pancreatitis including mild intrahepatic and extrahepatic biliary ductal dilation and cholelithiasis. MRCP negative for choledocholithiasis.  Today during my evaluation he reports slightly increased pain after drinking. Last episode of emesis was yesterday.  ROS: Review of Systems  Constitutional: Negative for chills, fever and weight loss.  Respiratory: Negative for shortness of breath.   Cardiovascular: Negative for chest pain.  Gastrointestinal: Positive for abdominal pain, heartburn, nausea and vomiting.  All other systems reviewed and are negative.    Family History  Problem Relation Age of Onset  . Diabetes type II Mother   . Stroke Mother   . Hypertension Mother   . Diabetes type II Father     Past Medical History:  Diagnosis Date  . Allergy   . Back pain   . Depression   . GERD (gastroesophageal reflux disease)   . Heroin use disorder, moderate, in sustained remission ([place])   . MRSA (methicillin resistant Staphylococcus aureus)   . Opioid use disorder, moderate, on maintenance therapy [facility])     Past  Surgical History:  Procedure Laterality Date  . KNEE ARTHROSCOPY Right     Social History:  reports that he has been smoking.  He has been smoking about 1.00 pack per day. He has never used smokeless tobacco. He reports that he does not drink alcohol or use drugs.  Allergies:  Allergies  Allergen Reactions  . Eggs Or Egg-Derived Products Anaphylaxis and Swelling  . Penicillins Hives    Has patient had a PCN reaction causing immediate rash, facial/tongue/throat swelling, SOB or lightheadedness with hypotension: unknown Has patient had a PCN reaction causing severe rash involving mucus membranes or skin necrosis: unknown Has patient had a PCN reaction that required hospitalization unknown Has patient had a PCN reaction occurring within the last 10 years: no If all of the above answers are "NO", then may proceed with Cephalosporin use.  Childhood allergy    Medications Prior to Admission  Medication Sig Dispense Refill  . albuterol (PROVENTIL HFA;VENTOLIN HFA) 108 (90 Base) MCG/ACT inhaler Inhale 2 puffs into the lungs every 6 (six) hours as needed for wheezing or shortness of breath. 1 Inhaler 2  . Buprenorphine HCl-Naloxone HCl (ZUBSOLV) 5.7-1.4 MG SUBL Place 1 tablet under the tongue 2 (two) times daily. 6 AM and 4 PM    . calcium-vitamin D (OSCAL WITH D) 500-200 MG-UNIT tablet Take 1 tablet by mouth daily with breakfast.    . CREATINE PO Take 1 tablet by mouth daily as needed (supplement).    Marland Kitchen ibuprofen (ADVIL,MOTRIN) 200 MG tablet Take 800 mg by mouth every 8 (eight) hours as needed for moderate pain.    . Multiple Vitamin (MULTIVITAMIN WITH MINERALS) TABS tablet Take 1 tablet by mouth  daily.    . pantoprazole (PROTONIX) 20 MG tablet Take 20 mg by mouth daily.    Marland Kitchen POTASSIUM GLUCONATE PO Take 1 tablet by mouth daily.    . ranitidine (ZANTAC) 75 MG tablet Take 75 mg by mouth 2 (two) times daily as needed for heartburn.    . ondansetron (ZOFRAN ODT) 8 MG disintegrating tablet '8mg'$  ODT  q4 hours prn nausea (Patient not taking: Reported on 06/26/2016) 6 tablet 0    Blood pressure 138/77, pulse 83, temperature 98.7 F (37.1 C), temperature source Oral, resp. rate 18, height '6\' 2"'$  (1.88 m), weight 108.4 kg (239 lb), SpO2 93 %. Physical Exam: General: pleasant AA male who is laying in bed in mild abdominal distress. HEENT: head is normocephalic, atraumatic.   Heart: regular, rate, and rhythm.  No obvious murmurs, gallops, or rubs noted Lungs: Respiratory effort nonlabored Abd: soft, NT/ND, +BS, no masses, hernias, or organomegaly MS: all 4 extremities are symmetrical with no cyanosis, clubbing, or edema. Skin: warm and dry with no masses, lesions, or rashes Psych: A&Ox3 with an appropriate affect. Neuro: grossly intact, moving all extremities spontaneously, normal speech  Results for orders placed or performed during the hospital encounter of 06/26/16 (from the past 48 hour(s))  Urinalysis, Routine w reflex microscopic     Status: Abnormal   Collection Time: 06/26/16  5:45 AM  Result Value Ref Range   Color, Urine YELLOW YELLOW   APPearance CLEAR CLEAR   Specific Gravity, Urine 1.011 1.005 - 1.030   pH 6.0 5.0 - 8.0   Glucose, UA NEGATIVE NEGATIVE mg/dL   Hgb urine dipstick SMALL (A) NEGATIVE   Bilirubin Urine NEGATIVE NEGATIVE   Ketones, ur NEGATIVE NEGATIVE mg/dL   Protein, ur NEGATIVE NEGATIVE mg/dL   Nitrite NEGATIVE NEGATIVE   Leukocytes, UA NEGATIVE NEGATIVE  Urinalysis, Microscopic (reflex)     Status: Abnormal   Collection Time: 06/26/16  5:45 AM  Result Value Ref Range   RBC / HPF 0-5 0 - 5 RBC/hpf   WBC, UA 0-5 0 - 5 WBC/hpf   Bacteria, UA RARE (A) NONE SEEN   Squamous Epithelial / LPF 0-5 (A) NONE SEEN  Comprehensive metabolic panel     Status: Abnormal   Collection Time: 06/26/16  6:29 AM  Result Value Ref Range   Sodium 139 135 - 145 mmol/L   Potassium 3.5 3.5 - 5.1 mmol/L   Chloride 104 101 - 111 mmol/L   CO2 30 22 - 32 mmol/L   Glucose, Bld 156  (H) 65 - 99 mg/dL   BUN 19 6 - 20 mg/dL   Creatinine, Ser 0.96 0.61 - 1.24 mg/dL   Calcium 9.1 8.9 - 10.3 mg/dL   Total Protein 7.1 6.5 - 8.1 g/dL   Albumin 4.0 3.5 - 5.0 g/dL   AST 440 (H) 15 - 41 U/L   ALT 366 (H) 17 - 63 U/L   Alkaline Phosphatase 92 38 - 126 U/L   Total Bilirubin 1.6 (H) 0.3 - 1.2 mg/dL   GFR calc non Af Amer >60 >60 mL/min   GFR calc Af Amer >60 >60 mL/min    Comment: (NOTE) The eGFR has been calculated using the CKD EPI equation. This calculation has not been validated in all clinical situations. eGFR's persistently <60 mL/min signify possible Chronic Kidney Disease.    Anion gap 5 5 - 15  Lipase, blood     Status: Abnormal   Collection Time: 06/26/16  6:29 AM  Result Value Ref Range  Lipase 4,850 (H) 11 - 51 U/L    Comment: RESULTS CONFIRMED BY MANUAL DILUTION  CBC WITH DIFFERENTIAL     Status: Abnormal   Collection Time: 06/26/16  6:29 AM  Result Value Ref Range   WBC 15.2 (H) 4.0 - 10.5 K/uL   RBC 4.71 4.22 - 5.81 MIL/uL   Hemoglobin 13.9 13.0 - 17.0 g/dL   HCT 40.9 39.0 - 52.0 %   MCV 86.8 78.0 - 100.0 fL   MCH 29.5 26.0 - 34.0 pg   MCHC 34.0 30.0 - 36.0 g/dL   RDW 14.1 11.5 - 15.5 %   Platelets 311 150 - 400 K/uL   Neutrophils Relative % 79 %   Neutro Abs 11.8 (H) 1.7 - 7.7 K/uL   Lymphocytes Relative 11 %   Lymphs Abs 1.7 0.7 - 4.0 K/uL   Monocytes Relative 9 %   Monocytes Absolute 1.4 (H) 0.1 - 1.0 K/uL   Eosinophils Relative 1 %   Eosinophils Absolute 0.1 0.0 - 0.7 K/uL   Basophils Relative 0 %   Basophils Absolute 0.0 0.0 - 0.1 K/uL  CBC     Status: Abnormal   Collection Time: 06/26/16  2:44 PM  Result Value Ref Range   WBC 14.8 (H) 4.0 - 10.5 K/uL   RBC 4.67 4.22 - 5.81 MIL/uL   Hemoglobin 13.4 13.0 - 17.0 g/dL   HCT 40.1 39.0 - 52.0 %   MCV 85.9 78.0 - 100.0 fL   MCH 28.7 26.0 - 34.0 pg   MCHC 33.4 30.0 - 36.0 g/dL   RDW 14.1 11.5 - 15.5 %   Platelets 276 150 - 400 K/uL  Lactate dehydrogenase     Status: Abnormal    Collection Time: 06/26/16  2:44 PM  Result Value Ref Range   LDH 327 (H) 98 - 192 U/L  Comprehensive metabolic panel     Status: Abnormal   Collection Time: 06/26/16  2:44 PM  Result Value Ref Range   Sodium 137 135 - 145 mmol/L   Potassium 3.7 3.5 - 5.1 mmol/L   Chloride 106 101 - 111 mmol/L   CO2 24 22 - 32 mmol/L   Glucose, Bld 105 (H) 65 - 99 mg/dL   BUN 13 6 - 20 mg/dL   Creatinine, Ser 0.81 0.61 - 1.24 mg/dL   Calcium 8.5 (L) 8.9 - 10.3 mg/dL   Total Protein 6.4 (L) 6.5 - 8.1 g/dL   Albumin 3.8 3.5 - 5.0 g/dL   AST 253 (H) 15 - 41 U/L   ALT 354 (H) 17 - 63 U/L   Alkaline Phosphatase 94 38 - 126 U/L   Total Bilirubin 1.2 0.3 - 1.2 mg/dL   GFR calc non Af Amer >60 >60 mL/min   GFR calc Af Amer >60 >60 mL/min    Comment: (NOTE) The eGFR has been calculated using the CKD EPI equation. This calculation has not been validated in all clinical situations. eGFR's persistently <60 mL/min signify possible Chronic Kidney Disease.    Anion gap 7 5 - 15  Lactic acid, plasma     Status: None   Collection Time: 06/26/16  2:44 PM  Result Value Ref Range   Lactic Acid, Venous 0.9 0.5 - 1.9 mmol/L  Urine rapid drug screen (hosp performed)     Status: Abnormal   Collection Time: 06/26/16  4:01 PM  Result Value Ref Range   Opiates POSITIVE (A) NONE DETECTED   Cocaine NONE DETECTED NONE DETECTED   Benzodiazepines NONE  DETECTED NONE DETECTED   Amphetamines NONE DETECTED NONE DETECTED   Tetrahydrocannabinol NONE DETECTED NONE DETECTED   Barbiturates NONE DETECTED NONE DETECTED    Comment:        DRUG SCREEN FOR MEDICAL PURPOSES ONLY.  IF CONFIRMATION IS NEEDED FOR ANY PURPOSE, NOTIFY LAB WITHIN 5 DAYS.        LOWEST DETECTABLE LIMITS FOR URINE DRUG SCREEN Drug Class       Cutoff (ng/mL) Amphetamine      1000 Barbiturate      200 Benzodiazepine   035 Tricyclics       009 Opiates          300 Cocaine          300 THC              50   Lactic acid, plasma     Status: None    Collection Time: 06/26/16  5:47 PM  Result Value Ref Range   Lactic Acid, Venous 0.9 0.5 - 1.9 mmol/L  Bilirubin, direct     Status: None   Collection Time: 06/26/16  5:47 PM  Result Value Ref Range   Bilirubin, Direct 0.2 0.1 - 0.5 mg/dL  Triglycerides     Status: None   Collection Time: 06/26/16  5:47 PM  Result Value Ref Range   Triglycerides 34 <150 mg/dL  Protime-INR     Status: None   Collection Time: 06/26/16  5:47 PM  Result Value Ref Range   Prothrombin Time 13.2 11.4 - 15.2 seconds   INR 1.00   CBC     Status: Abnormal   Collection Time: 06/27/16  4:27 AM  Result Value Ref Range   WBC 18.0 (H) 4.0 - 10.5 K/uL   RBC 4.32 4.22 - 5.81 MIL/uL   Hemoglobin 12.5 (L) 13.0 - 17.0 g/dL   HCT 37.1 (L) 39.0 - 52.0 %   MCV 85.9 78.0 - 100.0 fL   MCH 28.9 26.0 - 34.0 pg   MCHC 33.7 30.0 - 36.0 g/dL   RDW 14.3 11.5 - 15.5 %   Platelets 253 150 - 400 K/uL  Comprehensive metabolic panel     Status: Abnormal   Collection Time: 06/27/16  4:27 AM  Result Value Ref Range   Sodium 140 135 - 145 mmol/L   Potassium 3.4 (L) 3.5 - 5.1 mmol/L   Chloride 104 101 - 111 mmol/L   CO2 26 22 - 32 mmol/L   Glucose, Bld 114 (H) 65 - 99 mg/dL   BUN 14 6 - 20 mg/dL   Creatinine, Ser 0.87 0.61 - 1.24 mg/dL   Calcium 8.7 (L) 8.9 - 10.3 mg/dL   Total Protein 5.9 (L) 6.5 - 8.1 g/dL   Albumin 3.5 3.5 - 5.0 g/dL   AST 104 (H) 15 - 41 U/L   ALT 237 (H) 17 - 63 U/L   Alkaline Phosphatase 77 38 - 126 U/L   Total Bilirubin 0.9 0.3 - 1.2 mg/dL   GFR calc non Af Amer >60 >60 mL/min   GFR calc Af Amer >60 >60 mL/min    Comment: (NOTE) The eGFR has been calculated using the CKD EPI equation. This calculation has not been validated in all clinical situations. eGFR's persistently <60 mL/min signify possible Chronic Kidney Disease.    Anion gap 10 5 - 15   Ct Abdomen Pelvis W Contrast  Result Date: 06/26/2016 CLINICAL DATA:  46 year old male with acute onset abdominal pain localizing to the  periumbilical region  accompanied by nausea, vomiting and diarrhea. EXAM: CT ABDOMEN AND PELVIS WITH CONTRAST TECHNIQUE: Multidetector CT imaging of the abdomen and pelvis was performed using the standard protocol following bolus administration of intravenous contrast. CONTRAST:  134m ISOVUE-300 IOPAMIDOL (ISOVUE-300) INJECTION 61% COMPARISON:  Chest x-ray obtained today FINDINGS: Lower chest: Mild dependent atelectasis. The visualized cardiac structures are within normal limits. Small hiatal hernia. Hepatobiliary: Normal hepatic contour and morphology. No discrete hepatic lesion. Mild intrahepatic biliary ductal dilatation. The gallbladder is decompressed. Pancreas: The pancreas is edematous in the neck, head and uncinate process. No discrete mass lesion is identified. There is extensive peripancreatic inflammatory stranding and fluid extending into the root of the mesentery, extending through the gastrosplenic ligament into the left subdiaphragmatic space and extending along the gastrohepatic ligament into the right pericolic gutter. Fluid also extends down the left pericolic gutter. Spleen: Normal in size without focal abnormality. Adrenals/Urinary Tract: Normal adrenal glands. No hydronephrosis, enhancing renal mass or nephrolithiasis. 1.7 cm water attenuation cyst in the lower pole of the left kidney. Stomach/Bowel: No evidence of bowel obstruction. No focal bowel wall thickening. Normal appendix in the right lower quadrant. Vascular/Lymphatic: No evidence of arterial complication from acute pancreatitis. The portal and splenic veins remain widely patent. There is trace atherosclerotic calcification without evidence of stenosis. No aneurysm. Reproductive: Prostate is unremarkable. Other: No abdominal wall hernia or abnormality. No abdominopelvic ascites. Musculoskeletal: No acute fracture or aggressive appearing lytic or blastic osseous lesion. Probable remote healed right superior pubic ramus fracture.  IMPRESSION: 1. Acute pancreatitis affecting the pancreatic neck, head and uncinate process with extensive surrounding inflammatory change and edematous fluid. No complicating feature at this time. 2. Minimal intrahepatic biliary ductal dilatation likely secondary to pancreatitis as above. No definite cholelithiasis or choledocholithiasis seen. Electronically Signed   By: HJacqulynn CadetM.D.   On: 06/26/2016 08:30   Mr 3d Recon At Scanner  Result Date: 06/26/2016 CLINICAL DATA:  Acute pancreatitis EXAM: MRI ABDOMEN WITHOUT AND WITH CONTRAST (INCLUDING MRCP) TECHNIQUE: Multiplanar multisequence MR imaging of the abdomen was performed both before and after the administration of intravenous contrast. Heavily T2-weighted images of the biliary and pancreatic ducts were obtained, and three-dimensional MRCP images were rendered by post processing. CONTRAST:  212mMULTIHANCE GADOBENATE DIMEGLUMINE 529 MG/ML IV SOLN COMPARISON:  Ultrasound 06/26/2016, CT 06/26/2016 FINDINGS: Lower chest:  Lung bases are clear. Hepatobiliary: No intrahepatic duct dilatation. No focal hepatic lesion. The gallbladder contracted around multiple calculi entirely filling the lumen. The cystic duct appears normal. The common bile duct is mildly dilated 7 mm. There are no discrete filling defects within the common bile duct. Pancreas: The pancreas is edematous. The pancreatic duct is not dilated or interrupted. There is fluid extending along the LEFT and RIGHT anterior perirenal fascia and Morrison's pouch an dalong the pericolic gutters. No organized fluid collections. Pancreas enhances uniformly on the more delayed contrast series. Spleen: Normal spleen. Adrenals/urinary tract: Adrenal glands and kidneys are normal. Stomach/Bowel: Stomach and limited of the small bowel is unremarkable. Mild distention of small bowel likely represents ileus. Vascular/Lymphatic: Abdominal aortic normal caliber. No retroperitoneal periportal lymphadenopathy.  Musculoskeletal: No aggressive osseous lesion IMPRESSION: 1. Multiple gallstones packed within the contracted gallbladder. 2. Common bile duct is mildly dilated but there is no evidence of filling defect to suggest choledocholithiasis. 3. No significant intrahepatic duct dilatation. 4. Pancreatic duct is normal caliber. Pancreatic edema and peripancreatic fluid. No evidence of pancreatic ductal interruption. Pancreatic parenchyma enhances relatively uniformly. Electronically Signed   By: StNicole Kindred  Leonia Reeves M.D.   On: 06/26/2016 23:31   Dg Chest Portable 1 View  Result Date: 06/26/2016 CLINICAL DATA:  Abdominal pain and vomiting 1 day. EXAM: PORTABLE CHEST 1 VIEW COMPARISON:  06/12/2016 FINDINGS: Lungs are hypoinflated with minimal prominence of the perihilar markings. Mild linear right base opacification likely atelectasis. No evidence of effusion. Cardiomediastinal silhouette and remainder the exam is unchanged. IMPRESSION: Hypoinflation with mild right base opacification suggesting a linear atelectasis, although cannot exclude early infection. Electronically Signed   By: Marin Olp M.D.   On: 06/26/2016 07:30   Mr Abdomen Mrcp Moise Boring Contast  Result Date: 06/26/2016 CLINICAL DATA:  Acute pancreatitis EXAM: MRI ABDOMEN WITHOUT AND WITH CONTRAST (INCLUDING MRCP) TECHNIQUE: Multiplanar multisequence MR imaging of the abdomen was performed both before and after the administration of intravenous contrast. Heavily T2-weighted images of the biliary and pancreatic ducts were obtained, and three-dimensional MRCP images were rendered by post processing. CONTRAST:  18m MULTIHANCE GADOBENATE DIMEGLUMINE 529 MG/ML IV SOLN COMPARISON:  Ultrasound 06/26/2016, CT 06/26/2016 FINDINGS: Lower chest:  Lung bases are clear. Hepatobiliary: No intrahepatic duct dilatation. No focal hepatic lesion. The gallbladder contracted around multiple calculi entirely filling the lumen. The cystic duct appears normal. The common bile duct  is mildly dilated 7 mm. There are no discrete filling defects within the common bile duct. Pancreas: The pancreas is edematous. The pancreatic duct is not dilated or interrupted. There is fluid extending along the LEFT and RIGHT anterior perirenal fascia and Morrison's pouch an dalong the pericolic gutters. No organized fluid collections. Pancreas enhances uniformly on the more delayed contrast series. Spleen: Normal spleen. Adrenals/urinary tract: Adrenal glands and kidneys are normal. Stomach/Bowel: Stomach and limited of the small bowel is unremarkable. Mild distention of small bowel likely represents ileus. Vascular/Lymphatic: Abdominal aortic normal caliber. No retroperitoneal periportal lymphadenopathy. Musculoskeletal: No aggressive osseous lesion IMPRESSION: 1. Multiple gallstones packed within the contracted gallbladder. 2. Common bile duct is mildly dilated but there is no evidence of filling defect to suggest choledocholithiasis. 3. No significant intrahepatic duct dilatation. 4. Pancreatic duct is normal caliber. Pancreatic edema and peripancreatic fluid. No evidence of pancreatic ductal interruption. Pancreatic parenchyma enhances relatively uniformly. Electronically Signed   By: SSuzy BouchardM.D.   On: 06/26/2016 23:31   [place]dom[place] Limited Ruq  Result Date: 06/26/2016 CLINICAL DATA:  46year old male with history of elevated bilirubin and elevated liver enzymes. EXAM: [place]DOM[place] LIMITED - RIGHT UPPER QUADRANT COMPARISON:  No priors. FINDINGS: Gallbladder: Several small shadowing foci measuring up to 8 mm in diameter, compatible with multiple small gallstones. Gallbladder appears contracted around these gallstones. Gallbladder wall thickness is slightly increased at 4 mm. No pericholecystic fluid. Per report from the sonographer, the patient did not exhibit a sonographic Murphy's sign on examination. Common bile duct: Diameter: 8 mm Liver: Mild intrahepatic biliary ductal dilatation. No focal  lesion identified. Within normal limits in parenchymal echogenicity. IMPRESSION: 1. Cholelithiasis without definite evidence to suggest an acute cholecystitis at this time. 2. However, there is mild intra and extrahepatic biliary ductal dilatation. This may suggest the presence of a distal ductal stone, which could account for the patient's acute pancreatitis. Further evaluation with MRI of the abdomen with and without IV gadolinium with MRCP could provide additional diagnostic information if choledocholithiasis is suspected. Electronically Signed   By: DVinnie LangtonM.D.   On: 06/26/2016 16:30   Assessment/Plan Acute gallstone pancreatitis - lipase > 4,000 on admission, repeat pending  - given significantly elevated lipase and increased  pain with PO intake, recommend backing off diet to clear liquids until inflammatory markers decrease and patients pain improves. - will require laparoscopic cholecystectomy for definitive treatment, timing of surgery to be determined. Possibly tomorrow or Wednesday if patients lipase decreases and pain improves  Leukocytosis  Elevated LFT's - improving; AST 104, ALT 237, t.bili 0.9 Hypokalemia- mild, 3.4 Opioid dependence - pain control may be difficult Tobacco abuse    Jill Alexanders, [facility] 06/27/2016, 12:15 PM Pager: [phone] Consults: [phone] Mon-Fri 7:00 am-4:30 pm Sat-Sun 7:00 am-11:30 am

## 2016-06-28 ENCOUNTER — Inpatient Hospital Stay (HOSPITAL_COMMUNITY): Payer: BLUE CROSS/BLUE SHIELD | Admitting: Critical Care Medicine

## 2016-06-28 ENCOUNTER — Encounter (HOSPITAL_COMMUNITY)
Admission: EM | Disposition: A | Discharge status: Home / Self Care | Payer: Self-pay | Source: Home / Self Care | Attending: Nephrology

## 2016-06-28 ENCOUNTER — Encounter (HOSPITAL_COMMUNITY): Payer: Self-pay | Admitting: Critical Care Medicine

## 2016-06-28 ENCOUNTER — Inpatient Hospital Stay (HOSPITAL_COMMUNITY): Payer: BLUE CROSS/BLUE SHIELD

## 2016-06-28 DIAGNOSIS — K8020 Calculus of gallbladder without cholecystitis without obstruction: Secondary | ICD-10-CM

## 2016-06-28 HISTORY — PX: CHOLECYSTECTOMY: SHX55

## 2016-06-28 LAB — COMPREHENSIVE METABOLIC PANEL
ALT: 134 U/L — AB (ref 17–63)
AST: 38 U/L (ref 15–41)
Albumin: 3.2 g/dL — ABNORMAL LOW (ref 3.5–5.0)
Alkaline Phosphatase: 67 U/L (ref 38–126)
Anion gap: 10 (ref 5–15)
BILIRUBIN TOTAL: 1 mg/dL (ref 0.3–1.2)
BUN: 7 mg/dL (ref 6–20)
CO2: 26 mmol/L (ref 22–32)
CREATININE: 0.76 mg/dL (ref 0.61–1.24)
Calcium: 8.8 mg/dL — ABNORMAL LOW (ref 8.9–10.3)
Chloride: 102 mmol/L (ref 101–111)
GFR calc Af Amer: >60 mL/min (ref >60)
GLUCOSE: 100 mg/dL — AB (ref 65–99)
Potassium: 3.1 mmol/L — ABNORMAL LOW (ref 3.5–5.1)
Sodium: 138 mmol/L (ref 135–145)
TOTAL PROTEIN: 6 g/dL — AB (ref 6.5–8.1)

## 2016-06-28 LAB — CBC
HEMATOCRIT: 34 % — AB (ref 39.0–52.0)
HEMOGLOBIN: 11.4 g/dL — AB (ref 13.0–17.0)
MCH: 28.6 pg (ref 26.0–34.0)
MCHC: 33.5 g/dL (ref 30.0–36.0)
MCV: 85.2 fL (ref 78.0–100.0)
Platelets: 210 10*3/uL (ref 150–400)
RBC: 3.99 MIL/uL — AB (ref 4.22–5.81)
RDW: 14.1 % (ref 11.5–15.5)
WBC: 17.4 10*3/uL — AB (ref 4.0–10.5)

## 2016-06-28 LAB — SURGICAL PCR SCREEN
MRSA, PCR: NEGATIVE
Staphylococcus aureus: NEGATIVE

## 2016-06-28 LAB — LIPASE, BLOOD: Lipase: 114 U/L — ABNORMAL HIGH (ref 11–51)

## 2016-06-28 SURGERY — LAPAROSCOPIC CHOLECYSTECTOMY WITH INTRAOPERATIVE CHOLANGIOGRAM
Anesthesia: General | Site: Abdomen

## 2016-06-28 MED ORDER — SODIUM CHLORIDE 0.9 % IV SOLN
INTRAVENOUS | Status: DC | PRN
  Administered 2016-06-28: 20 mL

## 2016-06-28 MED ORDER — LACTATED RINGERS IV SOLN
INTRAVENOUS | Status: DC
  Administered 2016-06-28 (×2): via INTRAVENOUS

## 2016-06-28 MED ORDER — DEXAMETHASONE SODIUM PHOSPHATE 10 MG/ML IJ SOLN
INTRAMUSCULAR | Status: DC | PRN
  Administered 2016-06-28: 10 mg via INTRAVENOUS

## 2016-06-28 MED ORDER — IOPAMIDOL (ISOVUE-300) INJECTION 61%
INTRAVENOUS | Status: AC
  Filled 2016-06-28: qty 50

## 2016-06-28 MED ORDER — BUPIVACAINE HCL 0.25 % IJ SOLN
INTRAMUSCULAR | Status: DC | PRN
  Administered 2016-06-28: 20 mL

## 2016-06-28 MED ORDER — MUPIROCIN 2 % EX OINT
TOPICAL_OINTMENT | CUTANEOUS | Status: AC
  Administered 2016-06-28: 09:00:00
  Filled 2016-06-28: qty 22

## 2016-06-28 MED ORDER — FENTANYL CITRATE (PF) 100 MCG/2ML IJ SOLN
INTRAMUSCULAR | Status: DC | PRN
  Administered 2016-06-28 (×4): 50 ug via INTRAVENOUS
  Administered 2016-06-28: 100 ug via INTRAVENOUS
  Administered 2016-06-28 (×2): 50 ug via INTRAVENOUS

## 2016-06-28 MED ORDER — ROCURONIUM BROMIDE 50 MG/5ML IV SOSY
PREFILLED_SYRINGE | INTRAVENOUS | Status: DC | PRN
  Administered 2016-06-28: 50 mg via INTRAVENOUS
  Administered 2016-06-28: 10 mg via INTRAVENOUS

## 2016-06-28 MED ORDER — KETOROLAC TROMETHAMINE 30 MG/ML IJ SOLN
INTRAMUSCULAR | Status: DC | PRN
  Administered 2016-06-28: 30 mg via INTRAVENOUS

## 2016-06-28 MED ORDER — CIPROFLOXACIN IN D5W 400 MG/200ML IV SOLN
400.0000 mg | INTRAVENOUS | Status: AC
Start: 2016-06-28 — End: 2016-06-28
  Administered 2016-06-28: 400 mg via INTRAVENOUS
  Filled 2016-06-28: qty 200

## 2016-06-28 MED ORDER — ONDANSETRON HCL 4 MG/2ML IJ SOLN
INTRAMUSCULAR | Status: DC | PRN
  Administered 2016-06-28: 4 mg via INTRAVENOUS

## 2016-06-28 MED ORDER — FENTANYL CITRATE (PF) 100 MCG/2ML IJ SOLN
INTRAMUSCULAR | Status: AC
  Filled 2016-06-28: qty 4

## 2016-06-28 MED ORDER — 0.9 % SODIUM CHLORIDE (POUR BTL) OPTIME
TOPICAL | Status: DC | PRN
  Administered 2016-06-28: 1000 mL

## 2016-06-28 MED ORDER — SUGAMMADEX SODIUM 500 MG/5ML IV SOLN
INTRAVENOUS | Status: DC | PRN
  Administered 2016-06-28: 220 mg via INTRAVENOUS

## 2016-06-28 MED ORDER — FENTANYL CITRATE (PF) 100 MCG/2ML IJ SOLN
INTRAMUSCULAR | Status: AC
  Filled 2016-06-28: qty 2

## 2016-06-28 MED ORDER — PROPOFOL 10 MG/ML IV BOLUS
INTRAVENOUS | Status: DC | PRN
  Administered 2016-06-28: 150 mg via INTRAVENOUS
  Administered 2016-06-28: 50 mg via INTRAVENOUS

## 2016-06-28 MED ORDER — PROPOFOL 10 MG/ML IV BOLUS
INTRAVENOUS | Status: AC
  Filled 2016-06-28: qty 20

## 2016-06-28 MED ORDER — HYDROMORPHONE HCL 1 MG/ML IJ SOLN: 0.2500 mg | INTRAMUSCULAR | Status: DC | PRN

## 2016-06-28 MED ORDER — SODIUM CHLORIDE 0.9 % IV SOLN
30.0000 meq | Freq: Once | INTRAVENOUS | Status: AC
  Administered 2016-06-28: 30 meq via INTRAVENOUS
  Filled 2016-06-28: qty 15

## 2016-06-28 MED ORDER — MIDAZOLAM HCL 5 MG/5ML IJ SOLN
INTRAMUSCULAR | Status: DC | PRN
  Administered 2016-06-28: 2 mg via INTRAVENOUS

## 2016-06-28 MED ORDER — BUPIVACAINE HCL (PF) 0.25 % IJ SOLN
INTRAMUSCULAR | Status: AC
  Filled 2016-06-28: qty 30

## 2016-06-28 MED ORDER — SODIUM CHLORIDE 0.9 % IR SOLN
Status: DC | PRN
  Administered 2016-06-28: 1000 mL

## 2016-06-28 MED ORDER — MIDAZOLAM HCL 2 MG/2ML IJ SOLN
INTRAMUSCULAR | Status: AC
  Filled 2016-06-28: qty 2

## 2016-06-28 MED ORDER — LIDOCAINE 2% (20 MG/ML) 5 ML SYRINGE
INTRAMUSCULAR | Status: DC | PRN
  Administered 2016-06-28: 60 mg via INTRAVENOUS

## 2016-06-28 SURGICAL SUPPLY — 51 items
ADH SKN CLS APL DERMABOND .7 (GAUZE/BANDAGES/DRESSINGS) ×1
APPLIER CLIP ROT 10 11.4 M/L (STAPLE) ×2
APR CLP MED LRG 11.4X10 (STAPLE) ×1
BAG SPEC RTRVL 10 TROC 200 (ENDOMECHANICALS) ×1
BLADE CLIPPER SURG (BLADE) ×1 IMPLANT
CANISTER SUCT 3000ML PPV (MISCELLANEOUS) ×2 IMPLANT
CATH CHOLANG 76X19 KUMAR (CATHETERS) ×3 IMPLANT
CHLORAPREP W/TINT 26ML (MISCELLANEOUS) ×2 IMPLANT
CLIP APPLIE ROT 10 11.4 M/L (STAPLE) IMPLANT
CLIP LIGATING HEMO LOK XL GOLD (MISCELLANEOUS) ×2 IMPLANT
COVER MAYO STAND STRL (DRAPES) ×2 IMPLANT
COVER SURGICAL LIGHT HANDLE (MISCELLANEOUS) ×2 IMPLANT
DERMABOND ADVANCED (GAUZE/BANDAGES/DRESSINGS) ×1
DERMABOND ADVANCED .7 DNX12 (GAUZE/BANDAGES/DRESSINGS) ×1 IMPLANT
DEVICE PMI PUNCTURE CLOSURE (MISCELLANEOUS) ×2 IMPLANT
DRAPE C-ARM 42X72 X-RAY (DRAPES) ×2 IMPLANT
ELECT REM PT RETURN 9FT ADLT (ELECTROSURGICAL) ×2
ELECTRODE REM PT RTRN 9FT ADLT (ELECTROSURGICAL) ×1 IMPLANT
GLOVE BIO SURGEON STRL SZ 6.5 (GLOVE) ×1 IMPLANT
GLOVE BIO SURGEON STRL SZ7 (GLOVE) ×1 IMPLANT
GLOVE BIOGEL PI IND STRL 6.5 (GLOVE) IMPLANT
GLOVE BIOGEL PI IND STRL 7.0 (GLOVE) ×1 IMPLANT
GLOVE BIOGEL PI IND STRL 8 (GLOVE) IMPLANT
GLOVE BIOGEL PI INDICATOR 6.5 (GLOVE) ×1
GLOVE BIOGEL PI INDICATOR 7.0 (GLOVE) ×3
GLOVE BIOGEL PI INDICATOR 8 (GLOVE) ×1
GLOVE SURG SS PI 7.0 STRL IVOR (GLOVE) ×2 IMPLANT
GLOVE SURG SS PI 8.0 STRL IVOR (GLOVE) ×1 IMPLANT
GOWN STRL REUS W/ TWL LRG LVL3 (GOWN DISPOSABLE) ×3 IMPLANT
GOWN STRL REUS W/ TWL XL LVL3 (GOWN DISPOSABLE) IMPLANT
GOWN STRL REUS W/TWL LRG LVL3 (GOWN DISPOSABLE) ×4
GOWN STRL REUS W/TWL XL LVL3 (GOWN DISPOSABLE) ×2
GRASPER SUT TROCAR 14GX15 (MISCELLANEOUS) ×2 IMPLANT
KIT BASIN OR (CUSTOM PROCEDURE TRAY) ×2 IMPLANT
KIT ROOM TURNOVER OR (KITS) ×2 IMPLANT
NS IRRIG 1000ML POUR BTL (IV SOLUTION) ×2 IMPLANT
PAD ARMBOARD 7.5X6 YLW CONV (MISCELLANEOUS) ×2 IMPLANT
POUCH RETRIEVAL ECOSAC 10 (ENDOMECHANICALS) ×1 IMPLANT
POUCH RETRIEVAL ECOSAC 10MM (ENDOMECHANICALS) ×1
SCISSORS LAP 5X35 DISP (ENDOMECHANICALS) ×2 IMPLANT
SET CHOLANGIOGRAPH 5 50 .035 (SET/KITS/TRAYS/PACK) ×1 IMPLANT
SET IRRIG TUBING LAPAROSCOPIC (IRRIGATION / IRRIGATOR) ×2 IMPLANT
SLEEVE ENDOPATH XCEL 5M (ENDOMECHANICALS) ×4 IMPLANT
SPECIMEN JAR SMALL (MISCELLANEOUS) ×2 IMPLANT
STOPCOCK 4 WAY LG BORE MALE ST (IV SETS) ×2 IMPLANT
SUT MNCRL AB 4-0 PS2 18 (SUTURE) ×3 IMPLANT
TOWEL OR 17X24 6PK STRL BLUE (TOWEL DISPOSABLE) ×2 IMPLANT
TRAY LAPAROSCOPIC MC (CUSTOM PROCEDURE TRAY) ×2 IMPLANT
TROCAR XCEL 12X100 BLDLESS (ENDOMECHANICALS) ×2 IMPLANT
TROCAR XCEL NON-BLD 5MMX100MML (ENDOMECHANICALS) ×2 IMPLANT
TUBING INSUFFLATION (TUBING) ×2 IMPLANT

## 2016-06-28 NOTE — Anesthesia Postprocedure Evaluation (Signed)
Anesthesia Post Note  Patient: Horald ChestnutWilliam E Fromm  Procedure(s) Performed: Procedure(s) (LRB): LAPAROSCOPIC CHOLECYSTECTOMY WITH INTRAOPERATIVE CHOLANGIOGRAM (N/A)  Patient location during evaluation: PACU Anesthesia Type: General Level of consciousness: awake and alert Pain management: pain level controlled Vital Signs Assessment: post-procedure vital signs reviewed and stable Respiratory status: spontaneous breathing, nonlabored ventilation, respiratory function stable and patient connected to nasal cannula oxygen Cardiovascular status: blood pressure returned to baseline and stable Postop Assessment: no signs of nausea or vomiting Anesthetic complications: no       Last Vitals:  Vitals:   06/28/16 1200 06/28/16 1215  BP:    Pulse: 76 75  Resp: 18 18  Temp:      Last Pain:  Vitals:   06/28/16 1123  TempSrc:   PainSc: Asleep                 Nakisha Chai,W. EDMOND

## 2016-06-28 NOTE — Progress Notes (Signed)
Continues to slleep unless awkened/ interacts appropriately during convo/then falls asleep/ nausea resolved

## 2016-06-28 NOTE — Transfer of Care (Signed)
Immediate Anesthesia Transfer of Care Note  Patient: Grant ChestnutWilliam E Woods  Procedure(s) Performed: Procedure(s): LAPAROSCOPIC CHOLECYSTECTOMY WITH INTRAOPERATIVE CHOLANGIOGRAM (N/A)  Patient Location: PACU  Anesthesia Type:General  Level of Consciousness: awake, alert  and oriented  Airway & Oxygen Therapy: Patient Spontanous Breathing and Patient connected to nasal cannula oxygen  Post-op Assessment: Report given to RN and Post -op Vital signs reviewed and stable  Post vital signs: Reviewed and stable  Last Vitals:  Vitals:   06/27/16 2201 06/28/16 0613  BP: 130/79 (!) 146/87  Pulse: 97 80  Resp: 17 17  Temp: 36.9 C 36.9 C    Last Pain:  Vitals:   06/28/16 0730  TempSrc:   PainSc: 5          Complications: No apparent anesthesia complications

## 2016-06-28 NOTE — Progress Notes (Signed)
  Progress Note: General Surgery Service   Subjective: Pain improved, tolerated some food yesterday  Objective: Vital signs in last 24 hours: Temp:  [98.4 F (36.9 C)-98.8 F (37.1 C)] 98.4 F (36.9 C) (03/20 16100613) Pulse Rate:  [80-97] 80 (03/20 0613) Resp:  [17-20] 17 (03/20 0613) BP: (130-146)/(79-96) 146/87 (03/20 0613) SpO2:  [93 %-94 %] 94 % (03/20 0613) Weight:  [108 kg (238 lb 1.6 oz)] 108 kg (238 lb 1.6 oz) (03/20 0500) Last BM Date: 06/25/16  Intake/Output from previous day: 03/19 0701 - 03/20 0700 In: 2690 [I.V.:2665; IV Piggyback:25] Out: -  Intake/Output this shift: No intake/output data recorded.  Lungs: CTAB  Cardiovascular: RRR  Abd: soft, slight pain to palpation  Extremities: no edema  Neuro: AOx4  Lab Results: CBC   Recent Labs  06/27/16 0427 06/28/16 0600  WBC 18.0* 17.4*  HGB 12.5* 11.4*  HCT 37.1* 34.0*  PLT 253 210   BMET  Recent Labs  06/27/16 0427 06/28/16 0600  NA 140 138  K 3.4* 3.1*  CL 104 102  CO2 26 26  GLUCOSE 114* 100*  BUN 14 7  CREATININE 0.87 0.76  CALCIUM 8.7* 8.8*   PT/INR  Recent Labs  06/26/16 1747  LABPROT 13.2  INR 1.00   ABG No results for input(s): PHART, HCO3 in the last 72 hours.  Invalid input(s): PCO2, PO2  Studies/Results:  Anti-infectives: Anti-infectives    None      Medications: Scheduled Meds: . enoxaparin (LOVENOX) injection  40 mg Subcutaneous Daily  . famotidine (PEPCID) IV  10 mg Intravenous Q12H  . nicotine  14 mg Transdermal Daily  . oxyCODONE  10 mg Oral Q8H  . pantoprazole (PROTONIX) IV  40 mg Intravenous Q12H  . sodium chloride flush  3 mL Intravenous Q12H   Continuous Infusions: . lactated ringers 150 mL/hr at 06/27/16 1817   PRN Meds:.acetaminophen **OR** acetaminophen, albuterol, fentaNYL (SUBLIMAZE) injection, ketorolac, naLOXone (NARCAN)  injection  Assessment/Plan: Patient Active Problem List   Diagnosis Date Noted  . Elevated bilirubin   .  Pancreatitis 06/26/2016  . Acid reflux 06/26/2016  . Primary osteoarthritis of right knee 06/09/2016  . Achilles tendinitis of both lower extremities 06/09/2016  . Class 1 obesity due to excess calories without serious comorbidity with body mass index (BMI) of 30.0 to 30.9 in adult 06/09/2016  . Current every day smoker 06/09/2016  . Sleep disturbance 06/09/2016  . Heroin use disorder, moderate, in sustained remission (HCC)   . Opioid use disorder, moderate, on maintenance therapy (HCC)    Gallstone pancreatitis - improved   OR today for lap chole w ioc NPO preop abx  We discussed the etiology of his pain, we discussed treatment options and recommended surgery. We discussed details of surgery including general anesthesia, laparoscopic approach, identification of cystic duct and common bile duct. Ligation of cystic duct and cystic artery. Possible need for intraoperative cholangiogram or open procedure. Possible risks of common bile duct injury, liver injury, cystic duct leak, bleeding, infection, post-cholecystectomy syndrome. The patient showed good understanding and all questions were answered    LOS: 2 days   Rodman PickleLuke Aaron Kinsinger, MD Pg# [phone] [facility]

## 2016-06-28 NOTE — Anesthesia Preprocedure Evaluation (Addendum)
Anesthesia Evaluation  Patient identified by MRN, date of birth, ID band Patient awake    Reviewed: Allergy & Precautions, H&P , NPO status , Patient's Chart, lab work & pertinent test results  Airway Mallampati: II  TM Distance: >3 FB Neck ROM: Full    Dental no notable dental hx. (+) Teeth Intact, Dental Advisory Given   Pulmonary Current Smoker,    Pulmonary exam normal breath sounds clear to auscultation       Cardiovascular negative cardio ROS   Rhythm:Regular Rate:Normal     Neuro/Psych Depression negative neurological ROS     GI/Hepatic GERD  Medicated and Controlled,(+)     substance abuse  IV drug use,   Endo/Other  negative endocrine ROS  Renal/GU negative Renal ROS  negative genitourinary   Musculoskeletal  (+) Arthritis , Osteoarthritis,    Abdominal   Peds  Hematology negative hematology ROS (+)   Anesthesia Other Findings   Reproductive/Obstetrics negative OB ROS                            Anesthesia Physical Anesthesia Plan  ASA: II  Anesthesia Plan: General   Post-op Pain Management:    Induction: Intravenous  Airway Management Planned: Oral ETT  Additional Equipment:   Intra-op Plan:   Post-operative Plan: Extubation in OR  Informed Consent: I have reviewed the patients History and Physical, chart, labs and discussed the procedure including the risks, benefits and alternatives for the proposed anesthesia with the patient or authorized representative who has indicated his/her understanding and acceptance.   Dental advisory given  Plan Discussed with: CRNA  Anesthesia Plan Comments:         Anesthesia Quick Evaluation

## 2016-06-28 NOTE — Progress Notes (Signed)
PROGRESS NOTE    Grant Woods  ZOX:[id number] DOB: 07-03-1970 DOA: 06/26/2016 PCP: PROVIDER NOT IN SYSTEM   Brief Narrative: 46 y.o. male  past medical history of back pain, depression, substance use disorder on chronic Zubsolv  presents with abdominal pain. Patient is a transfer from [facility] with diagnosis of pancreatitis. ED course: CT scan done showing pancreatitis. Labs consistent with pancreatitis. Hospitalist was consulted for admission. Patient given 3 L of fluid prior to leaving ED.  Assessment & Plan:  # Acute gallstone pancreatitis, cholelithiasis, mild intra-/extrahepatic biliary dilatation: lipase 4850 on admission. -Already evaluated by gastroenterologist. Status post MRCP with cholelithiasis. -Evaluated by general surgery, one for laparoscopy cholecystectomy today. -Liver enzymes are trending down. Patient reported his abdomen pain is better. Able to tolerate soft diet yesterday. -Continue supportive care. Follow-up surgery's plan.  # History of polysubstance abuse on chronic Zubsolv: Currently on oxycodone 10 mg every 8 hourly to prevent opiates withdrawal. Patient reported he is doing well. Plan to resume his home medications after his pain is adequately controlled. I discussed with the patient and his wife at bedside today.  #Tobacco dependence: Continue nicotine patch  # mild hypokalemia: monitor labs. He was npo. Replete KCL. Monitor labs.   Principal Problem:   Pancreatitis Active Problems:   Current every day smoker   Acid reflux   Elevated bilirubin  DVT prophylaxis: lovenox sq. Code Status:full Family Communication: Discussed with the patient's wife at bedside Disposition Plan:likely dc home in 1-2 days. Awaiting GI and surgery's plan     Consultants:    gastroenterologist  General surgery   Procedures: MRCP  Antimicrobials: none   Subjective:  patient was seen and examined at bedside. Denies nausea vomiting. Abdomen pain  is better. Plan for surgery today. Denies any withdrawal symptoms. Wife at bedside Objective: Vitals:   06/27/16 1504 06/27/16 2201 06/28/16 0500 06/28/16 0613  BP: (!) 142/96 130/79  (!) 146/87  Pulse: 95 97  80  Resp: 20 17  17   Temp: 98.8 F (37.1 C) 98.5 F (36.9 C)  98.4 F (36.9 C)  TempSrc: Oral     SpO2: 93% 94%  94%  Weight:   108 kg (238 lb 1.6 oz)   Height:        Intake/Output Summary (Last 24 hours) at 06/28/16 1104 Last data filed at 06/28/16 0000  Gross per 24 hour  Intake             2690 ml  Output                0 ml  Net             2690 ml   Filed Weights   06/26/16 1124 06/27/16 0527 06/28/16 0500  Weight: 108.3 kg (238 lb 12.1 oz) 108.4 kg (239 lb) 108 kg (238 lb 1.6 oz)    Examination:  General exam: Not in distress, lying in bed comfortable  Respiratory system: Clear bilateral. Respiratory effort normal. No wheezing or crackle Cardiovascular system: Regular rate and rhythm, S1-S2 normal.  No pedal edema. Gastrointestinal system: Abdomen soft, nontender, nondistended. Bowel sound positive Central nervous system: Alert and oriented. No focal neurological deficits. Extremities: Symmetric 5 x 5 power. Skin: No rashes, lesions or ulcers Psychiatry: Judgement and insight appear normal. Mood & affect appropriate.     Data Reviewed: I have personally reviewed following labs and imaging studies  CBC:  Recent Labs Lab 06/26/16 0629 06/26/16 1444 06/27/16 0427 06/28/16 0600  WBC 15.2* 14.8* 18.0* 17.4*  NEUTROABS 11.8*  --   --   --   HGB 13.9 13.4 12.5* 11.4*  HCT 40.9 40.1 37.1* 34.0*  MCV 86.8 85.9 85.9 85.2  PLT 311 276 253 210   Basic Metabolic Panel:  Recent Labs Lab 06/26/16 0629 06/26/16 1444 06/27/16 0427 06/28/16 0600  NA 139 137 140 138  K 3.5 3.7 3.4* 3.1*  CL 104 106 104 102  CO2 30 24 26 26   GLUCOSE 156* 105* 114* 100*  BUN 19 13 14 7   CREATININE 0.96 0.81 0.87 0.76  CALCIUM 9.1 8.5* 8.7* 8.8*   GFR: Estimated  Creatinine Clearance: 151 mL/min (by C-G formula based on SCr of 0.76 mg/dL). Liver Function Tests:  Recent Labs Lab 06/26/16 0629 06/26/16 1444 06/27/16 0427 06/28/16 0600  AST 440* 253* 104* 38  ALT 366* 354* 237* 134*  ALKPHOS 92 94 77 67  BILITOT 1.6* 1.2 0.9 1.0  PROT 7.1 6.4* 5.9* 6.0*  ALBUMIN 4.0 3.8 3.5 3.2*    Recent Labs Lab 06/26/16 0629 06/28/16 0600  LIPASE 4,850* 114*   No results for input(s): AMMONIA in the last 168 hours. Coagulation Profile:  Recent Labs Lab 06/26/16 1747  INR 1.00   Cardiac Enzymes: No results for input(s): CKTOTAL, CKMB, CKMBINDEX, TROPONINI in the last 168 hours. BNP (last 3 results) No results for input(s): PROBNP in the last 8760 hours. HbA1C: No results for input(s): HGBA1C in the last 72 hours. CBG: No results for input(s): GLUCAP in the last 168 hours. Lipid Profile:  Recent Labs  06/26/16 1747  TRIG 34   Thyroid Function Tests: No results for input(s): TSH, T4TOTAL, FREET4, T3FREE, THYROIDAB in the last 72 hours. Anemia Panel: No results for input(s): VITAMINB12, FOLATE, FERRITIN, TIBC, IRON, RETICCTPCT in the last 72 hours. Sepsis Labs:  Recent Labs Lab 06/26/16 1444 06/26/16 1747  LATICACIDVEN 0.9 0.9    Recent Results (from the past 240 hour(s))  Surgical PCR screen     Status: None   Collection Time: 06/28/16  8:44 AM  Result Value Ref Range Status   MRSA, PCR NEGATIVE NEGATIVE Final   Staphylococcus aureus NEGATIVE NEGATIVE Final    Comment:        The Xpert SA Assay (FDA approved for NASAL specimens in patients over 61 years of age), is one component of a comprehensive surveillance program.  Test performance has been validated by [facility] for patients greater than or equal to 77 year old. It is not intended to diagnose infection nor to guide or monitor treatment.          Radiology Studies: Mr 3d Recon At Scanner  Result Date: 06/26/2016 CLINICAL DATA:  Acute pancreatitis EXAM:  MRI ABDOMEN WITHOUT AND WITH CONTRAST (INCLUDING MRCP) TECHNIQUE: Multiplanar multisequence MR imaging of the abdomen was performed both before and after the administration of intravenous contrast. Heavily T2-weighted images of the biliary and pancreatic ducts were obtained, and three-dimensional MRCP images were rendered by post processing. CONTRAST:  20mL MULTIHANCE GADOBENATE DIMEGLUMINE 529 MG/ML IV SOLN COMPARISON:  Ultrasound 06/26/2016, CT 06/26/2016 FINDINGS: Lower chest:  Lung bases are clear. Hepatobiliary: No intrahepatic duct dilatation. No focal hepatic lesion. The gallbladder contracted around multiple calculi entirely filling the lumen. The cystic duct appears normal. The common bile duct is mildly dilated 7 mm. There are no discrete filling defects within the common bile duct. Pancreas: The pancreas is edematous. The pancreatic duct is not dilated or interrupted. There is fluid extending  along the LEFT and RIGHT anterior perirenal fascia and Morrison's pouch an dalong the pericolic gutters. No organized fluid collections. Pancreas enhances uniformly on the more delayed contrast series. Spleen: Normal spleen. Adrenals/urinary tract: Adrenal glands and kidneys are normal. Stomach/Bowel: Stomach and limited of the small bowel is unremarkable. Mild distention of small bowel likely represents ileus. Vascular/Lymphatic: Abdominal aortic normal caliber. No retroperitoneal periportal lymphadenopathy. Musculoskeletal: No aggressive osseous lesion IMPRESSION: 1. Multiple gallstones packed within the contracted gallbladder. 2. Common bile duct is mildly dilated but there is no evidence of filling defect to suggest choledocholithiasis. 3. No significant intrahepatic duct dilatation. 4. Pancreatic duct is normal caliber. Pancreatic edema and peripancreatic fluid. No evidence of pancreatic ductal interruption. Pancreatic parenchyma enhances relatively uniformly. Electronically Signed   By: Genevive Bi M.D.    On: 06/26/2016 23:31   Mr Abdomen Mrcp Vivien Rossetti Contast  Result Date: 06/26/2016 CLINICAL DATA:  Acute pancreatitis EXAM: MRI ABDOMEN WITHOUT AND WITH CONTRAST (INCLUDING MRCP) TECHNIQUE: Multiplanar multisequence MR imaging of the abdomen was performed both before and after the administration of intravenous contrast. Heavily T2-weighted images of the biliary and pancreatic ducts were obtained, and three-dimensional MRCP images were rendered by post processing. CONTRAST:  20mL MULTIHANCE GADOBENATE DIMEGLUMINE 529 MG/ML IV SOLN COMPARISON:  Ultrasound 06/26/2016, CT 06/26/2016 FINDINGS: Lower chest:  Lung bases are clear. Hepatobiliary: No intrahepatic duct dilatation. No focal hepatic lesion. The gallbladder contracted around multiple calculi entirely filling the lumen. The cystic duct appears normal. The common bile duct is mildly dilated 7 mm. There are no discrete filling defects within the common bile duct. Pancreas: The pancreas is edematous. The pancreatic duct is not dilated or interrupted. There is fluid extending along the LEFT and RIGHT anterior perirenal fascia and Morrison's pouch an dalong the pericolic gutters. No organized fluid collections. Pancreas enhances uniformly on the more delayed contrast series. Spleen: Normal spleen. Adrenals/urinary tract: Adrenal glands and kidneys are normal. Stomach/Bowel: Stomach and limited of the small bowel is unremarkable. Mild distention of small bowel likely represents ileus. Vascular/Lymphatic: Abdominal aortic normal caliber. No retroperitoneal periportal lymphadenopathy. Musculoskeletal: No aggressive osseous lesion IMPRESSION: 1. Multiple gallstones packed within the contracted gallbladder. 2. Common bile duct is mildly dilated but there is no evidence of filling defect to suggest choledocholithiasis. 3. No significant intrahepatic duct dilatation. 4. Pancreatic duct is normal caliber. Pancreatic edema and peripancreatic fluid. No evidence of pancreatic  ductal interruption. Pancreatic parenchyma enhances relatively uniformly. Electronically Signed   By: Genevive Bi M.D.   On: 06/26/2016 23:31   US Abdomen Limited Ruq  Result Date: 06/26/2016 CLINICAL DATA:  46 year old male with history of elevated bilirubin and elevated liver enzymes. EXAM: US ABDOMEN LIMITED - RIGHT UPPER QUADRANT COMPARISON:  No priors. FINDINGS: Gallbladder: Several small shadowing foci measuring up to 8 mm in diameter, compatible with multiple small gallstones. Gallbladder appears contracted around these gallstones. Gallbladder wall thickness is slightly increased at 4 mm. No pericholecystic fluid. Per report from the sonographer, the patient did not exhibit a sonographic Murphy's sign on examination. Common bile duct: Diameter: 8 mm Liver: Mild intrahepatic biliary ductal dilatation. No focal lesion identified. Within normal limits in parenchymal echogenicity. IMPRESSION: 1. Cholelithiasis without definite evidence to suggest an acute cholecystitis at this time. 2. However, there is mild intra and extrahepatic biliary ductal dilatation. This may suggest the presence of a distal ductal stone, which could account for the patient's acute pancreatitis. Further evaluation with MRI of the abdomen with and without IV gadolinium with  MRCP could provide additional diagnostic information if choledocholithiasis is suspected. Electronically Signed   By: Trudie Reedaniel  Entrikin M.D.   On: 06/26/2016 16:30        Scheduled Meds: . [MAR Hold] enoxaparin (LOVENOX) injection  40 mg Subcutaneous Daily  . [MAR Hold] famotidine (PEPCID) IV  10 mg Intravenous Q12H  . mupirocin ointment      . [MAR Hold] nicotine  14 mg Transdermal Daily  . [MAR Hold] oxyCODONE  10 mg Oral Q8H  . [MAR Hold] pantoprazole (PROTONIX) IV  40 mg Intravenous Q12H  . potassium chloride (KCL MULTIRUN) 30 mEq in 265 mL IVPB  30 mEq Intravenous Once  . [MAR Hold] sodium chloride flush  3 mL Intravenous Q12H   Continuous  Infusions: . lactated ringers Stopped (06/28/16 0837)  . lactated ringers 10 mL/hr at 06/28/16 0919     LOS: 2 days    Lemon Whitacre Jaynie CollinsPrasad Nikolai Wilczak, MD [facility] Pager [phone]  If 7PM-7AM, please contact night-coverage [email or web address] Password [email or web address] 06/28/2016, 11:04 AM

## 2016-06-28 NOTE — Anesthesia Procedure Notes (Signed)
Procedure Name: Intubation Date/Time: 06/28/2016 9:56 AM Performed by: Merrilyn Puma B Pre-anesthesia Checklist: Patient identified, Emergency Drugs available, Suction available, Patient being monitored and Timeout performed Patient Re-evaluated:Patient Re-evaluated prior to inductionOxygen Delivery Method: Circle system utilized Preoxygenation: Pre-oxygenation with 100% oxygen Intubation Type: IV induction Ventilation: Oral airway inserted - appropriate to patient size and Mask ventilation without difficulty Laryngoscope Size: Mac and 4 Grade View: Grade III Tube type: Oral Number of attempts: 1 Airway Equipment and Method: Stylet Placement Confirmation: ETT inserted through vocal cords under direct vision,  positive ETCO2,  CO2 detector and breath sounds checked- equal and bilateral Secured at: 24 cm Tube secured with: Tape Dental Injury: Teeth and Oropharynx as per pre-operative assessment

## 2016-06-28 NOTE — Op Note (Signed)
PATIENT:  Grant Woods  46 y.o. male  PRE-OPERATIVE DIAGNOSIS:  GALLSTONES  POST-OPERATIVE DIAGNOSIS:  GALLSTONES  PROCEDURE:  Procedure(s): LAPAROSCOPIC CHOLECYSTECTOMY WITH INTRAOPERATIVE CHOLANGIOGRAM   SURGEON:  Surgeon(s): Rodman PickleLuke Aaron Inas Avena, MD   ASSISTANT: Mattie MarlinJessica Focht  ANESTHESIA:   local and general  Indications for procedure: Grant Woods is a 46 y.o. male with symptoms of Abdominal pain consistent with gallbladder disease, Confirmed by Ultrasound and CT.  Description of procedure: The patient was brought into the operative suite, placed supine. Anesthesia was administered with endotracheal tube. Patient was strapped in place and foot board was secured. All pressure points were offloaded by foam padding. The patient was prepped and draped in the usual sterile fashion.  A small incision was made to the right of the umbilicus. A 5mm trocar was inserted into the peritoneal cavity with optical entry. Pneumoperitoneum was applied with high flow low pressure. 2 5mm trocars were placed in the RUQ. A 12mm trocar was placed in the subxiphoid space. All trocars sites were first anesthesized with 0.25% marcaine with epinephrine in the subcutaneous and preperitoneal layers. Next the patient was placed in reverse trendelenberg. The gallbladder was pink in color with multiple adhesions of the omentum to the gallbladder these were taken down with cautery.  The gallbladder was retracted cephalad and lateral. The peritoneum was reflected off the infundibulum working lateral to medial. "The cystic duct and cystic artery were identified and further dissection revealed a critical view, due to concern for choledocholithiasis a cholangiogram was performed with ductotomy and cook catheter passed through a separate subcostal stab incision. Ductal anatomy appears normal with no filling defects. The cystic duct and cystic artery were doubly clipped and ligated.   The gallbladder was removed off the  liver bed with cautery. The Gallbladder was placed in a specimen bag. The gallbladder fossa was irrigated and hemostasis was applied with cautery. The gallbladder was removed via the 12mm trocar. The fascial defect was closed with interrupted 0 vicryl suture via laparoscopic trans-fascial suture passer. Pneumoperitoneum was removed, all trocar were removed. All incisions were closed with 4-0 monocryl subcuticular stitch. The patient woke from anesthesia and was brought to PACU in stable condition. All counts were correct  Findings: thickened gallbladder,normal ductal anatomy  Specimen: gallbladder  Blood loss: No intake/output data recorded. ml  Local anesthesia: 20ml 0.25% Marcaine  Complications: none  PLAN OF CARE: Admit to inpatient   PATIENT DISPOSITION:  PACU - hemodynamically stable.  Feliciana RossettiLuke Kona Lover, M.D. General, Bariatric, & Minimally Invasive Surgery [facility]

## 2016-06-29 ENCOUNTER — Encounter (HOSPITAL_COMMUNITY): Payer: Self-pay | Admitting: General Surgery

## 2016-06-29 LAB — CBC
HEMATOCRIT: 34.5 % — AB (ref 39.0–52.0)
Hemoglobin: 11.5 g/dL — ABNORMAL LOW (ref 13.0–17.0)
MCH: 28.3 pg (ref 26.0–34.0)
MCHC: 33.3 g/dL (ref 30.0–36.0)
MCV: 84.8 fL (ref 78.0–100.0)
PLATELETS: 230 10*3/uL (ref 150–400)
RBC: 4.07 MIL/uL — ABNORMAL LOW (ref 4.22–5.81)
RDW: 13.7 % (ref 11.5–15.5)
WBC: 17.5 10*3/uL — AB (ref 4.0–10.5)

## 2016-06-29 LAB — COMPREHENSIVE METABOLIC PANEL
ALT: 123 U/L — ABNORMAL HIGH (ref 17–63)
AST: 47 U/L — AB (ref 15–41)
Albumin: 3.4 g/dL — ABNORMAL LOW (ref 3.5–5.0)
Alkaline Phosphatase: 62 U/L (ref 38–126)
Anion gap: 11 (ref 5–15)
BILIRUBIN TOTAL: 0.9 mg/dL (ref 0.3–1.2)
BUN: 11 mg/dL (ref 6–20)
CHLORIDE: 102 mmol/L (ref 101–111)
CO2: 22 mmol/L (ref 22–32)
CREATININE: 0.68 mg/dL (ref 0.61–1.24)
Calcium: 8.9 mg/dL (ref 8.9–10.3)
Glucose, Bld: 104 mg/dL — ABNORMAL HIGH (ref 65–99)
POTASSIUM: 3.2 mmol/L — AB (ref 3.5–5.1)
Sodium: 135 mmol/L (ref 135–145)
TOTAL PROTEIN: 6.6 g/dL (ref 6.5–8.1)

## 2016-06-29 MED ORDER — IBUPROFEN 800 MG PO TABS: 800.0000 mg | ORAL_TABLET | Freq: Three times a day (TID) | ORAL | 0 refills | Status: DC | PRN

## 2016-06-29 NOTE — Discharge Summary (Signed)
Patient was given discharge instructions.  Patient and family verbalized understanding of discharge instructions.  Patient PIV was removed and area CDI. Patient discharge vitals Vitals:   06/28/16 2139 06/29/16 0557  BP: (!) 153/87 135/88  Pulse: 79 75  Resp: 18 18  Temp: 98.3 F (36.8 C) 98.4 F (36.9 C)   Patient discharge medications  Allergies as of 06/29/2016      Reactions   Eggs Or Egg-derived Products Anaphylaxis, Swelling   Penicillins Hives   Has patient had a PCN reaction causing immediate rash, facial/tongue/throat swelling, SOB or lightheadedness with hypotension: unknown Has patient had a PCN reaction causing severe rash involving mucus membranes or skin necrosis: unknown Has patient had a PCN reaction that required hospitalization unknown Has patient had a PCN reaction occurring within the last 10 years: no If all of the above answers are "NO", then may proceed with Cephalosporin use. Childhood allergy   Dye Fdc Red [red Dye]    Or yellow dye      Medication List    STOP taking these medications   ibuprofen 200 MG tablet Commonly known as:  ADVIL,MOTRIN Replaced by:  ibuprofen 800 MG tablet     TAKE these medications   albuterol 108 (90 Base) MCG/ACT inhaler Commonly known as:  PROVENTIL HFA;VENTOLIN HFA Inhale 2 puffs into the lungs every 6 (six) hours as needed for wheezing or shortness of breath.   calcium-vitamin D 500-200 MG-UNIT tablet Commonly known as:  OSCAL WITH D Take 1 tablet by mouth daily with breakfast.   CREATINE PO Take 1 tablet by mouth daily as needed (supplement).   ibuprofen 800 MG tablet Commonly known as:  ADVIL,MOTRIN Take 1 tablet (800 mg total) by mouth every 8 (eight) hours as needed. Replaces:  ibuprofen 200 MG tablet   multivitamin with minerals Tabs tablet Take 1 tablet by mouth daily.   ondansetron 8 MG disintegrating tablet Commonly known as:  ZOFRAN ODT 8mg  ODT q4 hours prn nausea   pantoprazole 20 MG  tablet Commonly known as:  PROTONIX Take 20 mg by mouth daily.   POTASSIUM GLUCONATE PO Take 1 tablet by mouth daily.   ranitidine 75 MG tablet Commonly known as:  ZANTAC Take 75 mg by mouth 2 (two) times daily as needed for heartburn.   ZUBSOLV 5.7-1.4 MG Subl Generic drug:  Buprenorphine HCl-Naloxone HCl Place 1 tablet under the tongue 2 (two) times daily. 6 AM and 4 PM       Patient walked out to the main lobby.   Danne HarborGlodean Jerri Glauser, RN  [phone] on 06/29/16

## 2016-06-29 NOTE — Progress Notes (Signed)
  Progress Note: General Surgery Service   Subjective: Patient feels better than yesterday, tolerated diet, some reflux  Objective: Vital signs in last 24 hours: Temp:  [98 F (36.7 C)-98.7 F (37.1 C)] 98.4 F (36.9 C) (03/21 0557) Pulse Rate:  [73-83] 75 (03/21 0557) Resp:  [17-20] 18 (03/21 0557) BP: (133-153)/(87-93) 135/88 (03/21 0557) SpO2:  [89 %-94 %] 94 % (03/21 0557) Weight:  [104.5 kg (230 lb 6.1 oz)] 104.5 kg (230 lb 6.1 oz) (03/21 0557) Last BM Date: 06/25/16  Intake/Output from previous day: 03/20 0701 - 03/21 0700 In: 4097.8 [I.V.:3782.8; IV Piggyback:315] Out: 725 [Urine:700; Blood:25] Intake/Output this shift: No intake/output data recorded.  Lungs: CTAB  Cardiovascular: RRR  Abd: soft, NT, ND, incisions c/d/i  Extremities: no edema  Neuro: AOx4  Lab Results: CBC   Recent Labs  06/27/16 0427 06/28/16 0600  WBC 18.0* 17.4*  HGB 12.5* 11.4*  HCT 37.1* 34.0*  PLT 253 210   BMET  Recent Labs  06/27/16 0427 06/28/16 0600  NA 140 138  K 3.4* 3.1*  CL 104 102  CO2 26 26  GLUCOSE 114* 100*  BUN 14 7  CREATININE 0.87 0.76  CALCIUM 8.7* 8.8*   PT/INR  Recent Labs  06/26/16 1747  LABPROT 13.2  INR 1.00   ABG No results for input(s): PHART, HCO3 in the last 72 hours.  Invalid input(s): PCO2, PO2  Studies/Results:  Anti-infectives: Anti-infectives    Start     Dose/Rate Route Frequency Ordered Stop   06/28/16 1200  ciprofloxacin (CIPRO) IVPB 400 mg     400 mg 200 mL/hr over 60 Minutes Intravenous To Short Stay 06/28/16 0732 06/28/16 1100      Medications: Scheduled Meds: . enoxaparin (LOVENOX) injection  40 mg Subcutaneous Daily  . famotidine (PEPCID) IV  10 mg Intravenous Q12H  . nicotine  14 mg Transdermal Daily  . oxyCODONE  10 mg Oral Q8H  . pantoprazole (PROTONIX) IV  40 mg Intravenous Q12H  . sodium chloride flush  3 mL Intravenous Q12H   Continuous Infusions: . lactated ringers 150 mL/hr at 06/29/16 0118  .  lactated ringers 10 mL/hr at 06/28/16 0919   PRN Meds:.acetaminophen **OR** acetaminophen, albuterol, fentaNYL (SUBLIMAZE) injection, ketorolac, naLOXone (NARCAN)  injection  Assessment/Plan: Patient Active Problem List   Diagnosis Date Noted  . Calculus of gallbladder without cholecystitis without obstruction   . Elevated bilirubin   . Pancreatitis 06/26/2016  . Acid reflux 06/26/2016  . Primary osteoarthritis of right knee 06/09/2016  . Achilles tendinitis of both lower extremities 06/09/2016  . Class 1 obesity due to excess calories without serious comorbidity with body mass index (BMI) of 30.0 to 30.9 in adult 06/09/2016  . Current every day smoker 06/09/2016  . Sleep disturbance 06/09/2016  . Heroin use disorder, moderate, in sustained remission (HCC)   . Opioid use disorder, moderate, on maintenance therapy (HCC)    s/p Procedure(s): LAPAROSCOPIC CHOLECYSTECTOMY WITH INTRAOPERATIVE CHOLANGIOGRAM 06/28/2016 -recommend discharge -no need to repeat labs -patient requests no narcotic RX, I wrote for full strength ibuprofen script -ok to resume maintenance suboxone   LOS: 3 days   Rodman PickleLuke Aaron Brysun Eschmann, MD Pg# [phone] [facility]

## 2016-06-29 NOTE — Discharge Summary (Signed)
Physician Discharge Summary  Grant Woods:[id number] DOB: [birth year] DOA: 06/26/2016  PCP: PROVIDER NOT IN SYSTEM  Admit date: 06/26/2016 Discharge date: 06/29/2016  Admitted From:home Disposition:home  Recommendations for Outpatient Follow-up:  1. Follow up with PCP in 1-2 weeks 2. Please obtain BMP/CBC in one week 3.  Home Health:no Equipment/Devices:no Discharge Condition:stable CODE STATUS:full Diet recommendation:soft/heart healthy diet  Brief/Interim Summary: 46 y.o.malepast medical history of back pain, depression, substance use disorder on chronic Zubsolv  presents with abdominal pain. Patient is a transfer from [facility] with diagnosis of pancreatitis. ED course: CT scan done showing pancreatitis. Labs consistent with pancreatitis. Hospitalist was consulted for admission. Patient given 3 L of fluid prior to leaving ED.  # Acute gallstone pancreatitis, cholelithiasis, mild intra-/extrahepatic biliary dilatation: lipase 4850 on admission. - evaluated by gastroenterologist. Status post MRCP with cholelithiasis. -Evaluated by general surgery, s/p laparoscopy cholecystectomy and intraoperative cholangiogram on 06/28/2016. -The patient clinically improved. Denied nausea vomiting or abdominal pain. Able to tolerate diet. Passing bowel. I discussed with the surgeon Dr. Sheliah Hatch. Patient will be discharged home with his wife today. Patient verbalized understanding.  # History of polysubstance abuse on chronic Zubsolv: Resume home medications. Recommended to follow up with PCP in 1-2 weeks.  #Tobacco dependence: Continue nicotine patch  # mild hypokalemia: Repleted potassium. Patient now taking oral intake. Recommended to monitor labs with PCP in a week.  Patient underwent surgical intervention with medication. Reported clinical improvement. No nausea vomiting or abdominal pain. Tolerating diet well. I discussed with the patient and his wife at bedside.  Resuming home medications with PCP follow-up. Patient is medically stable on discharge.  Discharge Diagnoses:  Principal Problem:   Pancreatitis Active Problems:   Current every day smoker   Acid reflux   Elevated bilirubin   Calculus of gallbladder without cholecystitis without obstruction    Discharge Instructions  Discharge Instructions    Call MD for:  difficulty breathing, headache or visual disturbances    Complete by:  As directed    Call MD for:  hives    Complete by:  As directed    Call MD for:  persistant nausea and vomiting    Complete by:  As directed    Call MD for:  redness, tenderness, or signs of infection (pain, swelling, redness, odor or green/yellow discharge around incision site)    Complete by:  As directed    Call MD for:  severe uncontrolled pain    Complete by:  As directed    Call MD for:  temperature >100.4    Complete by:  As directed    Diet - low sodium heart healthy    Complete by:  As directed    Discharge wound care:    Complete by:  As directed    Ok to shower tomorrow. Glue will likely peel off in 1-3 weeks. No bandage required   Driving Restrictions    Complete by:  As directed    No driving while on narcotics   Increase activity slowly    Complete by:  As directed    Increase activity slowly    Complete by:  As directed    Lifting restrictions    Complete by:  As directed    No lifting greater than 20 pounds for 3 weeks     Allergies as of 06/29/2016      Reactions   Eggs Or Egg-derived Products Anaphylaxis, Swelling   Penicillins Hives   Has patient had a  PCN reaction causing immediate rash, facial/tongue/throat swelling, SOB or lightheadedness with hypotension: unknown Has patient had a PCN reaction causing severe rash involving mucus membranes or skin necrosis: unknown Has patient had a PCN reaction that required hospitalization unknown Has patient had a PCN reaction occurring within the last 10 years: no If all of the above  answers are "NO", then may proceed with Cephalosporin use. Childhood allergy   Dye Fdc Red [red Dye]    Or yellow dye      Medication List    STOP taking these medications   ibuprofen 200 MG tablet Commonly known as:  ADVIL,MOTRIN Replaced by:  ibuprofen 800 MG tablet     TAKE these medications   albuterol 108 (90 Base) MCG/ACT inhaler Commonly known as:  PROVENTIL HFA;VENTOLIN HFA Inhale 2 puffs into the lungs every 6 (six) hours as needed for wheezing or shortness of breath.   calcium-vitamin D 500-200 MG-UNIT tablet Commonly known as:  OSCAL WITH D Take 1 tablet by mouth daily with breakfast.   CREATINE PO Take 1 tablet by mouth daily as needed (supplement).   ibuprofen 800 MG tablet Commonly known as:  ADVIL,MOTRIN Take 1 tablet (800 mg total) by mouth every 8 (eight) hours as needed. Replaces:  ibuprofen 200 MG tablet   multivitamin with minerals Tabs tablet Take 1 tablet by mouth daily.   ondansetron 8 MG disintegrating tablet Commonly known as:  ZOFRAN ODT 8mg  ODT q4 hours prn nausea   pantoprazole 20 MG tablet Commonly known as:  PROTONIX Take 20 mg by mouth daily.   POTASSIUM GLUCONATE PO Take 1 tablet by mouth daily.   ranitidine 75 MG tablet Commonly known as:  ZANTAC Take 75 mg by mouth 2 (two) times daily as needed for heartburn.   ZUBSOLV 5.7-1.4 MG Subl Generic drug:  Buprenorphine HCl-Naloxone HCl Place 1 tablet under the tongue 2 (two) times daily. 6 AM and 4 PM      Follow-up Information    [facility], [place]. Schedule an appointment as soon as possible for a visit in 3 week(s).   Specialty:  General Surgery Contact information: [address] [place] [address] [phone]         Allergies  Allergen Reactions  . Eggs Or Egg-Derived Products Anaphylaxis and Swelling  . Penicillins Hives    Has patient had a PCN reaction causing immediate rash, facial/tongue/throat swelling, SOB or  lightheadedness with hypotension: unknown Has patient had a PCN reaction causing severe rash involving mucus membranes or skin necrosis: unknown Has patient had a PCN reaction that required hospitalization unknown Has patient had a PCN reaction occurring within the last 10 years: no If all of the above answers are "NO", then may proceed with Cephalosporin use.  Childhood allergy  . Dye Fdc Red [Red Dye]     Or yellow dye    Consultations: General surgery Gastroenterology  Procedures/Studies: Laparoscopic cholecystectomy, MRCP  Subjective: Patient was seen and examined at bedside. Reported doing well. Denies nausea vomiting or abdominal pain. No chest pain or shortness of breath. Wife at bedside.  Discharge Exam: Vitals:   06/28/16 2139 06/29/16 0557  BP: (!) 153/87 135/88  Pulse: 79 75  Resp: 18 18  Temp: 98.3 F (36.8 C) 98.4 F (36.9 C)   Vitals:   06/28/16 1236 06/28/16 1400 06/28/16 2139 06/29/16 0557  BP: (!) 147/88 (!) 148/87 (!) 153/87 135/88  Pulse: 74 77 79 75  Resp: 20  18 18  Temp: 98.7 F (37.1 C) 98 F (36.7 C) 98.3 F (36.8 C) 98.4 F (36.9 C)  TempSrc: Oral Oral    SpO2: 92% 94% 92% 94%  Weight:    104.5 kg (230 lb 6.1 oz)  Height:        General: Pt is alert, awake, not in acute distress Cardiovascular: RRR, S1/S2 +, no rubs, no gallops Respiratory: CTA bilaterally, no wheezing, no rhonchi Abdominal: Soft, NT, ND, bowel sounds + Extremities: no edema, no cyanosis    The results of significant diagnostics from this hospitalization (including imaging, microbiology, ancillary and laboratory) are listed below for reference.     Microbiology: Recent Results (from the past 240 hour(s))  Surgical PCR screen     Status: None   Collection Time: 06/28/16  8:44 AM  Result Value Ref Range Status   MRSA, PCR NEGATIVE NEGATIVE Final   Staphylococcus aureus NEGATIVE NEGATIVE Final    Comment:        The Xpert SA Assay (FDA approved for NASAL  specimens in patients over 47 years of age), is one component of a comprehensive surveillance program.  Test performance has been validated by [facility] for patients greater than or equal to 47 year old. It is not intended to diagnose infection nor to guide or monitor treatment.      Labs: BNP (last 3 results) No results for input(s): BNP in the last 8760 hours. Basic Metabolic Panel:  Recent Labs Lab 06/26/16 0629 06/26/16 1444 06/27/16 0427 06/28/16 0600  NA 139 137 140 138  K 3.5 3.7 3.4* 3.1*  CL 104 106 104 102  CO2 30 24 26 26   GLUCOSE 156* 105* 114* 100*  BUN 19 13 14 7   CREATININE 0.96 0.81 0.87 0.76  CALCIUM 9.1 8.5* 8.7* 8.8*   Liver Function Tests:  Recent Labs Lab 06/26/16 0629 06/26/16 1444 06/27/16 0427 06/28/16 0600  AST 440* 253* 104* 38  ALT 366* 354* 237* 134*  ALKPHOS 92 94 77 67  BILITOT 1.6* 1.2 0.9 1.0  PROT 7.1 6.4* 5.9* 6.0*  ALBUMIN 4.0 3.8 3.5 3.2*    Recent Labs Lab 06/26/16 0629 06/28/16 0600  LIPASE 4,850* 114*   No results for input(s): AMMONIA in the last 168 hours. CBC:  Recent Labs Lab 06/26/16 0629 06/26/16 1444 06/27/16 0427 06/28/16 0600  WBC 15.2* 14.8* 18.0* 17.4*  NEUTROABS 11.8*  --   --   --   HGB 13.9 13.4 12.5* 11.4*  HCT 40.9 40.1 37.1* 34.0*  MCV 86.8 85.9 85.9 85.2  PLT 311 276 253 210   Cardiac Enzymes: No results for input(s): CKTOTAL, CKMB, CKMBINDEX, TROPONINI in the last 168 hours. BNP: Invalid input(s): POCBNP CBG: No results for input(s): GLUCAP in the last 168 hours. D-Dimer No results for input(s): DDIMER in the last 72 hours. Hgb A1c No results for input(s): HGBA1C in the last 72 hours. Lipid Profile  Recent Labs  06/26/16 1747  TRIG 34   Thyroid function studies No results for input(s): TSH, T4TOTAL, T3FREE, THYROIDAB in the last 72 hours.  Invalid input(s): FREET3 Anemia work up No results for input(s): VITAMINB12, FOLATE, FERRITIN, TIBC, IRON, RETICCTPCT in the  last 72 hours. Urinalysis    Component Value Date/Time   COLORURINE YELLOW 06/26/2016 0545   APPEARANCEUR CLEAR 06/26/2016 0545   LABSPEC 1.011 06/26/2016 0545   PHURINE 6.0 06/26/2016 0545   GLUCOSEU NEGATIVE 06/26/2016 0545   HGBUR SMALL (A) 06/26/2016 0545   BILIRUBINUR NEGATIVE 06/26/2016 0545  KETONESUR NEGATIVE 06/26/2016 0545   PROTEINUR NEGATIVE 06/26/2016 0545   NITRITE NEGATIVE 06/26/2016 0545   LEUKOCYTESUR NEGATIVE 06/26/2016 0545   Sepsis Labs Invalid input(s): PROCALCITONIN,  WBC,  LACTICIDVEN Microbiology Recent Results (from the past 240 hour(s))  Surgical PCR screen     Status: None   Collection Time: 06/28/16  8:44 AM  Result Value Ref Range Status   MRSA, PCR NEGATIVE NEGATIVE Final   Staphylococcus aureus NEGATIVE NEGATIVE Final    Comment:        The Xpert SA Assay (FDA approved for NASAL specimens in patients over 36 years of age), is one component of a comprehensive surveillance program.  Test performance has been validated by [facility] for patients greater than or equal to 63 year old. It is not intended to diagnose infection nor to guide or monitor treatment.      Time coordinating discharge: 26 minutes  SIGNED:   Maxie Barb, MD  [facility] 06/29/2016, 10:24 AM  If 7PM-7AM, please contact night-coverage [email or web address] Password [email or web address]

## 2016-06-30 NOTE — Progress Notes (Signed)
Late note for coding clarification.   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

## 2016-09-14 ENCOUNTER — Encounter: Payer: Self-pay | Admitting: Sports Medicine

## 2016-09-14 ENCOUNTER — Telehealth: Payer: Self-pay | Admitting: Family Medicine

## 2016-09-14 ENCOUNTER — Ambulatory Visit (INDEPENDENT_AMBULATORY_CARE_PROVIDER_SITE_OTHER): Payer: BLUE CROSS/BLUE SHIELD | Admitting: Sports Medicine

## 2016-09-14 ENCOUNTER — Ambulatory Visit: Payer: Self-pay

## 2016-09-14 VITALS — BP 120/82 | HR 81 | Ht 74.0 in | Wt 235.4 lb

## 2016-09-14 DIAGNOSIS — M25561 Pain in right knee: Secondary | ICD-10-CM | POA: Diagnosis not present

## 2016-09-14 DIAGNOSIS — M1711 Unilateral primary osteoarthritis, right knee: Secondary | ICD-10-CM | POA: Diagnosis not present

## 2016-09-14 DIAGNOSIS — R079 Chest pain, unspecified: Secondary | ICD-10-CM | POA: Diagnosis not present

## 2016-09-14 NOTE — Telephone Encounter (Signed)
When I first called the patient to try and set up the Home sleep study he stated he was at work right now and would call us back that was 06/22/2016. When I called back the 2nd time the patient stated he wanted to call me back. The 3rd time I spoke with the patient he stated to put the home sleep order on hold and that he had surgery and was trying to catch up at work that was on 08/12/2016

## 2016-09-14 NOTE — Progress Notes (Signed)
OFFICE VISIT NOTE Grant Woods. [facility] [phone]  Grant Woods - 46 y.o. male MRN [id number]  Date of birth: 08-09-1970  Visit Date: 09/14/2016  PCP: Grant Rima, DO   Referred by: Grant Rima, DO  Grant Woods, CMA acting as scribe for Dr. Berline Woods.  SUBJECTIVE:   Chief Complaint  Patient presents with  . osteoarthritis of the right knee   HPI: As below and per problem based documentation when appropriate.  Pt presents today with complaint of right knee pain. Pt has also noticed some swelling on the lateral aspect of the knee. Pain seems to be all over the knee but the knee is tender to palpation on the medial aspect of the knee.  Pain has been present x 2 weeks.  Pt had knee surgery as a child.   The pain is described as aching and throbbing pain and is rated as 10/10.  Worsened with extending the leg, and is worse when first getting up to walk.  Improves with rest Therapies tried include : Pt has tried Ibuprofen with some relief. He has tried icing the knee with minimal relief.   Other associated symptoms include: none  Pt denies fever, chills, night sweats, unintentional weight gain or loss.     Review of Systems  Constitutional: Negative for chills and fever.  Respiratory: Negative for shortness of breath and wheezing.   Cardiovascular: Positive for chest pain and leg swelling. Negative for palpitations.  Musculoskeletal: Negative for falls.  Neurological: Negative for dizziness, tingling and headaches.  Endo/Heme/Allergies: Does not bruise/bleed easily.    Otherwise per HPI.  HISTORY & PERTINENT PRIOR DATA:  No specialty comments available. He reports that he has been smoking.  He has been smoking about 1.00 pack per day. He has never used smokeless tobacco. No results for input(s): HGBA1C, LABURIC in the last 8760 hours. Medications & Allergies reviewed per EMR Patient Active Problem  List   Diagnosis Date Noted  . Right knee pain 09/22/2016  . Chest pain 09/22/2016  . Calculus of gallbladder without cholecystitis without obstruction   . Elevated bilirubin   . Pancreatitis 06/26/2016  . Acid reflux 06/26/2016  . Primary osteoarthritis of right knee 06/09/2016  . Achilles tendinitis of both lower extremities 06/09/2016  . Class 1 obesity due to excess calories without serious comorbidity with body mass index (BMI) of 30.0 to 30.9 in adult 06/09/2016  . Current every day smoker 06/09/2016  . Sleep disturbance 06/09/2016  . Heroin use disorder, moderate, in sustained remission (HCC)   . Opioid use disorder, moderate, on maintenance therapy [facility])    Past Medical History:  Diagnosis Date  . Allergy   . Back pain   . Depression   . GERD (gastroesophageal reflux disease)   . Heroin use disorder, moderate, in sustained remission (HCC)   . MRSA (methicillin resistant Staphylococcus aureus)   . Opioid use disorder, moderate, on maintenance therapy (HCC)    Family History  Problem Relation Age of Onset  . Diabetes type II Mother   . Stroke Mother   . Hypertension Mother   . Diabetes type II Father    Past Surgical History:  Procedure Laterality Date  . CHOLECYSTECTOMY N/A 06/28/2016   Procedure: LAPAROSCOPIC CHOLECYSTECTOMY WITH INTRAOPERATIVE CHOLANGIOGRAM;  Surgeon: Grant Pickle, MD;  Location: [facility] OR;  Service: General;  Laterality: N/A;  . KNEE ARTHROSCOPY Right    Social History   Occupational History  .  Shipping Nurse, [employer]   Social History Main Topics  . Smoking status: Current Every Day Smoker    Packs/day: 1.00  . Smokeless tobacco: Never Used  . Alcohol use No  . Drug use: No     Comment: in remission from heroin   . Sexual activity: Yes    Partners: Female    Birth control/ protection: None    OBJECTIVE:  VS:  HT:6\' 2"  (188 cm)   WT:235 lb 6.4 oz (106.8 kg)  BMI:30.3    BP:120/82  HR:81bpm  TEMP: ( )  RESP:94 % EXAM:    Findings:  WDWN, NAD, Non-toxic appearing Alert & appropriately interactive Not depressed or anxious appearing No increased work of breathing. Pupils are equal. EOM intact without nystagmus No clubbing or cyanosis of the extremities appreciated No significant rashes/lesions/ulcerations overlying the examined area. DP & PT pulses 2+/4.  No significant pretibial edema. Sensation intact to light touch in lower extremities. Abdomen soft nontender,  Right Knee: Overall joint is well aligned, no significant deformity.   No significant effusion.   ROM: 0 to 120.   Extensor mechanism intact Generalized medial joint line pain.   2-3 mm opening with valgus testing.  Stable to anterior and posterior drawer.  Normal Lachman's.   Negative McMurray's and Thessaly  EKG: Normal sinus rhythm, no evidence of ischemia.  Nonspecific T-wave changes.      [place] Limited Joint Space Structures Low Right(no Linked Charges)  Result Date: 09/23/2016 Please see Notes or Procedures tab for imaging impression.  ASSESSMENT & PLAN:   Problem List Items Addressed This Visit    Primary osteoarthritis of right knee   Right knee pain    Symptoms are consistent with osteoarthritis.  Plan to inject today.  We will have him work on strengthening of the knee with hip abduction and VMO strengthening exercises reviewed with Grant KohutJames Woods, ATC   PROCEDURE NOTE -  ULTRASOUND GUIDEDINJECTION: Right KNee Images were obtained and interpreted by myself, Grant BiddingMichael Ronica Vivian, DO  Images have been saved and stored to PACS system. Images obtained on: GE S7 Ultrasound machine  ULTRASOUND FINDINGS: Moderate degenerative spurring with extrusion of the medial meniscus.  No significant effusion  DESCRIPTION OF PROCEDURE:  The patient's clinical condition is marked by substantial pain and/or significant functional disability. Other conservative therapy has not provided relief, is contraindicated, or not appropriate. There is a  reasonable likelihood that injection will significantly improve the patient's pain and/or functional impairment. After discussing the risks, benefits and expected outcomes of the injection and all questions were reviewed and answered, the patient wished to undergo the above named procedure. Verbal consent was obtained. The ultrasound was used to identify the target structure and adjacent neurovascular structures. The skin was then prepped in sterile fashion and the target structure was injected under direct visualization using sterile technique as below: PREP: Alcohol, Ethel Chloride APPROACH: Superiolateral, 21 g 2 " needle INJECTATE: 2cc 1% lidocaine, 2cc 40mg  DepoMedrol ASPIRATE: N/a  DRESSING: Band-Aid   Post procedural instructions including recommending icing and warning signs for infection were reviewed. This procedure was well tolerated and there were no complications.   IMPRESSION: Succesful US Guided Injection    +++++++++++++++++++++++++++++++++++++++++++++++++++++++++++++++ PROCEDURE NOTE: THERAPEUTIC EXERCISES (97110) 15 minutes spent for Therapeutic exercises as stated in above notes.  This included exercises focusing on stretching, strengthening, with significant focus on eccentric aspects.   Proper technique shown and discussed handout in great detail with ATC.  All questions were discussed and answered.  Relevant Orders   [place] LIMITED JOINT SPACE STRUCTURES LOW RIGHT(NO LINKED CHARGES) (Completed)   Chest pain - Primary    Nonspecific gastric pain that lasted for 2-3 hours and had no exertional component.  EKG is reassuring for no acute ischemia and symptoms are more consistent with gastritis given increased intake use.  We will have him wean off of this and take Tylenol and allow steroid work.  If persistent symptoms follow-up with PCP.      Relevant Orders   EKG 12-Lead (Completed)      Follow-up: Return in about 4 weeks (around 10/12/2016).   CMA/ATC served as  Neurosurgeon during this visit. History, Physical, and Plan performed by medical provider. Documentation and orders reviewed and attested to.      Grant Bidding, DO    [facility]

## 2016-09-22 DIAGNOSIS — M25561 Pain in right knee: Secondary | ICD-10-CM | POA: Insufficient documentation

## 2016-09-22 DIAGNOSIS — R079 Chest pain, unspecified: Secondary | ICD-10-CM | POA: Insufficient documentation

## 2016-09-22 NOTE — Assessment & Plan Note (Signed)
Symptoms are consistent with osteoarthritis.  Plan to inject today.  We will have him work on strengthening of the knee with hip abduction and VMO strengthening exercises reviewed with James Jefferson, ATC   PROCEDUDonzetta KohutE NOTE -  ULTRASOUND GUIDEDINJECTION: Right KNee Images were obtained and interpreted by myself, Gaspar BiddingMichael Hall Birchard, DO  Images have been saved and stored to PACS system. Images obtained on: GE S7 Ultrasound machine  ULTRASOUND FINDINGS: Moderate degenerative spurring with extrusion of the medial meniscus.  No significant effusion  DESCRIPTION OF PROCEDURE:  The patient's clinical condition is marked by substantial pain and/or significant functional disability. Other conservative therapy has not provided relief, is contraindicated, or not appropriate. There is a reasonable likelihood that injection will significantly improve the patient's pain and/or functional impairment. After discussing the risks, benefits and expected outcomes of the injection and all questions were reviewed and answered, the patient wished to undergo the above named procedure. Verbal consent was obtained. The ultrasound was used to identify the target structure and adjacent neurovascular structures. The skin was then prepped in sterile fashion and the target structure was injected under direct visualization using sterile technique as below: PREP: Alcohol, Ethel Chloride APPROACH: Superiolateral, 21 g 2 " needle INJECTATE: 2cc 1% lidocaine, 2cc 40mg  DepoMedrol ASPIRATE: N/a  DRESSING: Band-Aid   Post procedural instructions including recommending icing and warning signs for infection were reviewed. This procedure was well tolerated and there were no complications.   IMPRESSION: Succesful US Guided Injection    +++++++++++++++++++++++++++++++++++++++++++++++++++++++++++++++ PROCEDURE NOTE: THERAPEUTIC EXERCISES ([id number]) 15 minutes spent for Therapeutic exercises as stated in above notes.  This included  exercises focusing on stretching, strengthening, with significant focus on eccentric aspects.   Proper technique shown and discussed handout in great detail with ATC.  All questions were discussed and answered.

## 2016-09-26 ENCOUNTER — Other Ambulatory Visit: Payer: Self-pay

## 2016-09-26 ENCOUNTER — Telehealth: Payer: Self-pay | Admitting: Family Medicine

## 2016-09-26 DIAGNOSIS — G479 Sleep disorder, unspecified: Secondary | ICD-10-CM

## 2016-09-26 NOTE — Telephone Encounter (Signed)
Order has been placed for sleep study.  Placed patient's wife's phone number on order for scheduling purposes.

## 2016-09-26 NOTE — Telephone Encounter (Signed)
Patient wife called in to request that sleep study referral be placed again. She stated that she knew nothing about the referral and would handle scheduling it.

## 2016-09-27 NOTE — Assessment & Plan Note (Signed)
Nonspecific gastric pain that lasted for 2-3 hours and had no exertional component.  EKG is reassuring for no acute ischemia and symptoms are more consistent with gastritis given increased intake use.  We will have him wean off of this and take Tylenol and allow steroid work.  If persistent symptoms follow-up with PCP.

## 2016-10-14 ENCOUNTER — Encounter: Payer: Self-pay | Admitting: Sports Medicine

## 2016-10-14 ENCOUNTER — Ambulatory Visit (INDEPENDENT_AMBULATORY_CARE_PROVIDER_SITE_OTHER): Payer: BLUE CROSS/BLUE SHIELD | Admitting: Sports Medicine

## 2016-10-14 VITALS — BP 108/78 | HR 72 | Ht 74.0 in | Wt 236.2 lb

## 2016-10-14 DIAGNOSIS — M25561 Pain in right knee: Secondary | ICD-10-CM

## 2016-10-14 DIAGNOSIS — M1711 Unilateral primary osteoarthritis, right knee: Secondary | ICD-10-CM

## 2016-10-14 MED ORDER — DICLOFENAC SODIUM 2 % TD SOLN: 1.0000 "application " | Freq: Two times a day (BID) | TRANSDERMAL | 2 refills | Status: DC

## 2016-10-14 MED ORDER — DICLOFENAC SODIUM 2 % TD SOLN: 1.0000 "application " | Freq: Two times a day (BID) | TRANSDERMAL | 0 refills | Status: AC

## 2016-10-14 NOTE — Assessment & Plan Note (Signed)
Pain is significant improved.  He does continue to have a small amount of synovitis with small effusion today on exam.  Continue with compression and therapeutic exercises as previously discussed.  Prescription for Pennsaid provided today to be used on a as needed basis.  We will plan to follow-up with him on an as-needed basis for repeat aspirations and injections for worsening symptoms.  Emphasized the importance of remaining active and keeping his knee strong while avoiding exacerbating activities.

## 2016-10-14 NOTE — Progress Notes (Signed)
OFFICE VISIT NOTE [facility]. [name] at [facility] [phone]  DENIS CARREON - 46 y.o. male MRN [id number]  Date of birth: May 19, 1970  Visit Date: 10/14/2016  PCP: Helane Rima, DO   Referred by: Helane Rima, DO  Orlie Dakin, CMA acting as scribe for Dr. Berline Chough.  SUBJECTIVE:   Chief Complaint  Patient presents with  . Follow-up    osteoarthritis of the right knee   HPI: As below and per problem based documentation when appropriate.  Pt presents today in follow-up of right knee pain. He had u/s guided injection done 09/14/16 and was given home exercises.   Pt reports improvement in knee pain since receiving the steroid injection. He had some trouble the first couple of days after the injection but is doing much better now. He has occasional minimal pain but nothing really bothersome, he can just tell its "not 100%". When he does feel pain its when he stands after sitting for a long period of time. He has not really been doing his home exercises but he has been doing a lot of swimming. He hasn't been doing to the gym much recently because he is nervous about the pain returning.   Pt . Denies fever, chills, night sweats.     Review of Systems  Constitutional: Negative for chills and fever.  Respiratory: Positive for wheezing. Negative for shortness of breath.   Cardiovascular: Negative for chest pain and palpitations.  Musculoskeletal: Negative for falls.  Neurological: Negative for dizziness, tingling and headaches.  Endo/Heme/Allergies: Does not bruise/bleed easily.    Otherwise per HPI.  HISTORY & PERTINENT PRIOR DATA:  No specialty comments available. He reports that he has been smoking.  He has been smoking about 1.00 pack per day. He has never used smokeless tobacco. No results for input(s): HGBA1C, LABURIC in the last 8760 hours. Medications & Allergies reviewed per EMR Patient Active Problem List   Diagnosis  Date Noted  . Right knee pain 09/22/2016  . Chest pain 09/22/2016  . Calculus of gallbladder without cholecystitis without obstruction   . Elevated bilirubin   . Pancreatitis 06/26/2016  . Acid reflux 06/26/2016  . Primary osteoarthritis of right knee 06/09/2016  . Achilles tendinitis of both lower extremities 06/09/2016  . Class 1 obesity due to excess calories without serious comorbidity with body mass index (BMI) of 30.0 to 30.9 in adult 06/09/2016  . Current every day smoker 06/09/2016  . Sleep disturbance 06/09/2016  . Heroin use disorder, moderate, in sustained remission (HCC)   . Opioid use disorder, moderate, on maintenance therapy [facility])    Past Medical History:  Diagnosis Date  . Allergy   . Back pain   . Depression   . GERD (gastroesophageal reflux disease)   . Heroin use disorder, moderate, in sustained remission (HCC)   . MRSA (methicillin resistant Staphylococcus aureus)   . Opioid use disorder, moderate, on maintenance therapy (HCC)    Family History  Problem Relation Age of Onset  . Diabetes type II Mother   . Stroke Mother   . Hypertension Mother   . Diabetes type II Father    Past Surgical History:  Procedure Laterality Date  . CHOLECYSTECTOMY N/A 06/28/2016   Procedure: LAPAROSCOPIC CHOLECYSTECTOMY WITH INTRAOPERATIVE CHOLANGIOGRAM;  Surgeon: Rodman Pickle, MD;  Location: [facility] OR;  Service: General;  Laterality: N/A;  . KNEE ARTHROSCOPY Right    Social History   Occupational History  . [employer]   Social History Main Topics  . Smoking status: Current Every Day Smoker    Packs/day: 1.00  . Smokeless tobacco: Never Used  . Alcohol use No  . Drug use: No     Comment: in remission from heroin   . Sexual activity: Yes    Partners: Female    Birth control/ protection: None    OBJECTIVE:  VS:  HT:6\' 2"  (188 cm)   WT:236 lb 3.2 oz (107.1 kg)  BMI:30.4    BP:108/78  HR:72bpm  TEMP: ( )  RESP:94 % EXAM: Findings:  Right  knee is overall well aligned.  He has a small amount of swelling with generalized synovitis.  Small effusion.  He has moderate degree of discomfort with varus testing with 2-3 mm of opening with this.  He is stable to valgus testing.  Anterior posterior drawer is normal.  Extensor mechanism intact.  Pain with McMurray's.     [place] Limited Joint Space Structures Low Right(no Linked Charges)  Result Date: 09/23/2016 Please see Notes or Procedures tab for imaging impression.  ASSESSMENT & PLAN:   Problem List Items Addressed This Visit    Primary osteoarthritis of right knee    Pain is significant improved.  He does continue to have a small amount of synovitis with small effusion today on exam.  Continue with compression and therapeutic exercises as previously discussed.  Prescription for Pennsaid provided today to be used on a as needed basis.  We will plan to follow-up with him on an as-needed basis for repeat aspirations and injections for worsening symptoms.  Emphasized the importance of remaining active and keeping his knee strong while avoiding exacerbating activities.      Right knee pain - Primary      Follow-up: Return if symptoms worsen or fail to improve.  No future appointments.   CMA/ATC served as Neurosurgeonscribe during this visit. History, Physical, and Plan performed by medical provider. Documentation and orders reviewed and attested to.      Gaspar BiddingMichael Taichi Repka, DO    [facility] Physician

## 2016-11-08 DIAGNOSIS — G4733 Obstructive sleep apnea (adult) (pediatric): Secondary | ICD-10-CM | POA: Diagnosis not present

## 2016-11-14 ENCOUNTER — Other Ambulatory Visit: Payer: Self-pay | Admitting: *Deleted

## 2016-11-14 DIAGNOSIS — G479 Sleep disorder, unspecified: Secondary | ICD-10-CM

## 2016-11-14 DIAGNOSIS — G4733 Obstructive sleep apnea (adult) (pediatric): Secondary | ICD-10-CM | POA: Diagnosis not present

## 2016-11-17 ENCOUNTER — Telehealth: Payer: Self-pay | Admitting: Family Medicine

## 2016-11-17 ENCOUNTER — Other Ambulatory Visit: Payer: Self-pay

## 2016-11-17 ENCOUNTER — Telehealth: Payer: Self-pay

## 2016-11-17 DIAGNOSIS — G4730 Sleep apnea, unspecified: Secondary | ICD-10-CM

## 2016-11-17 NOTE — Telephone Encounter (Signed)
-----   Message from Helane RimaErica Wallace, DO sent at 11/13/2016  7:59 PM EDT ----- Please call patient to let him know the information below. He needs to be scheduled for a split night study.  ----- Message ----- From: Coralyn HellingSood, Vineet, MD Sent: 11/10/2016   9:56 AM To: Helane RimaErica Wallace, DO  Your pt, Mr. Grant Woods, had a home sleep study on 11/08/16.  This showed severe obstructive sleep apnea with an AHI of 88.6 and SaO2 low of 76%.  The study showed by scanned into Epic soon.  Please let me know if I can be of further assistance.  Vineet Sood [facility], and Sleep Medicine

## 2016-11-17 NOTE — Telephone Encounter (Signed)
Wife Shimina, calling to inform that the sleep study was completed.   She would like a call back to discuss.  Ty,  -LL

## 2016-11-17 NOTE — Telephone Encounter (Signed)
Please see other message.  Order placed for split night study.

## 2016-11-17 NOTE — Telephone Encounter (Signed)
Spoke with patient and informed him of sleep study results.  Referral placed for split night sleep study per Dr. Earlene PlaterWallace.  They will contact patient to schedule.

## 2016-11-20 ENCOUNTER — Encounter: Payer: Self-pay | Admitting: Family Medicine

## 2016-11-28 ENCOUNTER — Ambulatory Visit (HOSPITAL_BASED_OUTPATIENT_CLINIC_OR_DEPARTMENT_OTHER): Payer: BLUE CROSS/BLUE SHIELD | Attending: Family Medicine | Admitting: Internal Medicine

## 2016-11-28 VITALS — Ht 73.0 in | Wt 240.0 lb

## 2016-11-28 DIAGNOSIS — G4733 Obstructive sleep apnea (adult) (pediatric): Secondary | ICD-10-CM | POA: Insufficient documentation

## 2016-11-28 DIAGNOSIS — I493 Ventricular premature depolarization: Secondary | ICD-10-CM | POA: Insufficient documentation

## 2016-11-28 DIAGNOSIS — G4730 Sleep apnea, unspecified: Secondary | ICD-10-CM

## 2016-12-01 ENCOUNTER — Telehealth: Payer: Self-pay | Admitting: Family Medicine

## 2016-12-01 DIAGNOSIS — G4730 Sleep apnea, unspecified: Secondary | ICD-10-CM

## 2016-12-01 NOTE — Telephone Encounter (Signed)
Results are not yet available.  Per pulmonology, it can take up to a week for results to become available.

## 2016-12-01 NOTE — Telephone Encounter (Signed)
Patients wife Shimina called in to inquire on sleep study results. Pulmonary advised her that she needs to contact primary care for result read. This is the 11/28/2016 encounter. No interpretation entered from what I can see. She advised that the patient will not be available at his phone, but ok to contact her via cell. Verified DPR and mobile # listed for shimina.

## 2016-12-01 NOTE — Telephone Encounter (Signed)
Left voicemail informing patient's wife that it can take a week or more to receive results and that when results are available they are forwarded to Dr. Earlene Plater and we will contact patient and/or wife with results.

## 2016-12-03 DIAGNOSIS — G4730 Sleep apnea, unspecified: Secondary | ICD-10-CM | POA: Diagnosis not present

## 2016-12-03 NOTE — Procedures (Signed)
Patient Name: Grant Woods, Grant Woods Date: 11/28/2016 Gender: Male D.O.B: 11-24-1970 Age (years): 45 Referring Provider: Briscoe Deutscher DO Height (inches): 73 Interpreting Physician: Baird Lyons MD, ABSM Weight (lbs): 240 RPSGT: Carolin Coy BMI: 32 MRN: [id number] Neck Size: 16.00 CLINICAL INFORMATION The patient is referred for a split night study with BiPAP. Most recent unattended home sleep test dated 11/08/2016 revealed an AHI of 88.6/h, desaturation to 76%. MEDICATIONS Medications self-administered by patient taken the night of the study : none reported  SLEEP STUDY TECHNIQUE As per the AASM Manual for the Scoring of Sleep and Associated Events v2.3 (April 2016) with a hypopnea requiring 4% desaturations.  The channels recorded and monitored were frontal, central and occipital EEG, electrooculogram (EOG), submentalis EMG (chin), nasal and oral airflow, thoracic and abdominal wall motion, anterior tibialis EMG, snore microphone, electrocardiogram, and pulse oximetry. Bi-level positive airway pressure (BiPAP) was initiated when the patient met split night criteria and was titrated according to treat sleep-disordered breathing.  RESPIRATORY PARAMETERS Diagnostic  Total AHI (/hr): 98.4 RDI (/hr): 101.9 OA Index (/hr): 64.4 CA Index (/hr): 0.4 REM AHI (/hr): 73.2 NREM AHI (/hr): 105.4 Supine AHI (/hr): 98.4 Non-supine AHI (/hr): N/A Min O2 Sat (%): 84.00 Mean O2 (%): 93.97 Time below 88% (min): 2.4   Titration  Optimal IPAP Pressure (cm): 16 Optimal EPAP Pressure (cm): 11 AHI at Optimal Pressure (/hr): 0.0 Min O2 at Optimal Pressure (%): 95.0 Sleep % at Optimal (%): 100 Supine % at Optimal (%): 100      SLEEP ARCHITECTURE The study was initiated at 9:50:42 PM and terminated at 5:14:22 AM. The total recorded time was 443.7 minutes. EEG confirmed total sleep time was 429.0 minutes yielding a sleep efficiency of 96.7%. Sleep onset after lights out was 1.6 minutes with a REM  latency of 55.0 minutes. The patient spent 10.26% of the night in stage N1 sleep, 68.76% in stage N2 sleep, 0.00% in stage N3 and 20.98% in REM. Wake after sleep onset (WASO) was 13.1 minutes. The Arousal Index was 19.3/hour.  LEG MOVEMENT DATA The total Periodic Limb Movements of Sleep (PLMS) were 0. The PLMS index was 0.00 .  CARDIAC DATA The 2 lead EKG demonstrated sinus rhythm. The mean heart rate was 57.54 beats per minute. Other EKG findings include: PVCs.  IMPRESSIONS - Severe obstructive sleep apnea occurred during the diagnostic portion of the study (AHI = 98.4 /hour). CPAP did not control apneas and was converted to BiPAP for central apneas and patient comfort. Optimal BIPAP pressures 16/11, PS 4. - Mild central sleep apnea occurred during the diagnostic portion of the study (CAI = 0.4/hour). - Moderate oxygen desaturation was noted during the diagnostic portion of the study (Min O2 = 84.00%). - The patient snored with Moderate snoring volume during the diagnostic portion of the study. - EKG findings include PVCs. - Clinically significant periodic limb movements of sleep did not occur during the study.  DIAGNOSIS - Obstructive Sleep Apnea (327.23 [G47.33 ICD-10])  RECOMMENDATIONS - Trial of BiPAP therapy on 16/11 cm H2O with a Large size Fisher[employer]Paykel Full Face Mask Simplus mask and heated humidification. - Be careful with alcohol, sedatives and other CNS depressants that may worsen sleep apnea and disrupt normal sleep architecture. - Sleep hygiene should be reviewed to assess factors that may improve sleep quality. - Weight management and regular exercise should be initiated or continued.  [Electronically signed] 12/03/2016 02:54 PM  Baird Lyons MD, ABSM Diplomate, American Board of Sleep Medicine   NPI:  [id number]  [place], American Board of Sleep Medicine  ELECTRONICALLY SIGNED ON:  12/03/2016, 2:49 PM [place] PH: [phone]  [phone]   FX: [phone] [place]

## 2016-12-21 NOTE — Telephone Encounter (Signed)
Left voicemail for patient to call office back. Per Dr. Earlene PlaterWallace, patient has severe sleep apnea and is needing intervention. A Bipap is requested for the patient and is waiting to hear back on. A referral to pulmonary has also been patient. Plans to let patient and wife aware of sleep study results and interventions put in place for them.

## 2016-12-21 NOTE — Addendum Note (Signed)
Addended by: Shade FloodJONES, MICHELLE O on: 12/21/2016 02:16 PM   Modules accepted: Orders

## 2016-12-21 NOTE — Telephone Encounter (Signed)
Per the sleep study:  DIAGNOSIS - Obstructive Sleep Apnea (327.23 [G47.33 ICD-10])  RECOMMENDATIONS - Trial of BiPAP therapy on 16/11 cm H2O with a Large size Fisher&Paykel Full Face Mask Simplus mask and heated  Humidification.  I apologize for the confusion. I must have misunderstood - thought that Pulmonology took over this order. Marcelino DusterMichelle, please reach out to Pulmonology to see how I proceed with this and future split night orders.

## 2016-12-21 NOTE — Telephone Encounter (Signed)
Patient's wife Adele DanShimina called expressing that "she was upset that she has not received a call regarding the patient's sleep study results." Shimina stated that "she did not want only a telephone note to be done due to this step being done in the past (in August) and not hearing anything back which resulted in the call today." Patient stated "she wanted an update today from the clinical team and did not want to end the call without an update." I advised Shimina that I would see if I could get further explanation on the topic and would place her on hold.  I placed the call on hold and went to speak with Marcelino DusterMichelle (clinical supervisor) to further investigate the results. After speaking to Vernice JeffersonMichelle, Michelle stated that Adele DanShimina will have an update from a clinical staff member by the end of the business day today.  I advised Shimina of the update on receiving a call by the end of the day and she was appreciative and stated "she looks forward to the call."

## 2016-12-21 NOTE — Telephone Encounter (Signed)
Spoke with Clinic Supervisor at [facility]. Based off conversation, Dr. Earlene PlaterWallace would like to have Pulmonary follow the patient and initiate BiPap needs. Plans to put order in for Pulmonary and DME referral for sleep and starting Bipap trial.

## 2016-12-21 NOTE — Telephone Encounter (Signed)
Referral to Pulmonary and DME have been placed

## 2016-12-21 NOTE — Telephone Encounter (Signed)
Please review sleep study asap Please! Thank you.

## 2016-12-22 NOTE — Telephone Encounter (Signed)
Spoke to patient's wife.The wife was very upset, stating she does not "[facility]  She was made aware of pulmonary referral and referral for DME. Ensured the patient's phone number was updated in the system. Patient continued to be ill-mannered during call. Service recovery was applied and ensured patient and wife did not have further questions nor need further assistance before ending call.

## 2016-12-22 NOTE — Telephone Encounter (Signed)
FYI

## 2017-01-18 ENCOUNTER — Encounter (HOSPITAL_BASED_OUTPATIENT_CLINIC_OR_DEPARTMENT_OTHER): Payer: BLUE CROSS/BLUE SHIELD

## 2017-01-24 ENCOUNTER — Telehealth: Payer: Self-pay | Admitting: Pulmonary Disease

## 2017-01-24 ENCOUNTER — Telehealth: Payer: Self-pay | Admitting: Family Medicine

## 2017-01-24 NOTE — Telephone Encounter (Addendum)
Spoke with wife, she states she has not heard from the DME company about a CPAP machine and it is really hard to tell where they sent the order to. I called their office and the referral specialist left for the day. The operator stated she would have him call me back in the morning. I will await call. I advised her RA could not sign off on order because he has not been seen yet. If this is incorrect RA, let me know I can order for pt. His appt is on 11/28.

## 2017-01-24 NOTE — Telephone Encounter (Signed)
Delaney Meigs with [facility] called in reference to referral for patient. Delaney Meigs stated patient stated no one called her and Delaney Meigs needed to know how to follow up with patient for this referral. Please call Delaney Meigs and advise.

## 2017-01-24 NOTE — Telephone Encounter (Signed)
We cannot order equipment without seeing pt PCP can We can only try to get him sooner appt with any sleep doc

## 2017-01-25 DIAGNOSIS — F1121 Opioid dependence, in remission: Secondary | ICD-10-CM | POA: Diagnosis not present

## 2017-01-25 DIAGNOSIS — Z79891 Long term (current) use of opiate analgesic: Secondary | ICD-10-CM | POA: Diagnosis not present

## 2017-01-25 NOTE — Telephone Encounter (Signed)
Called and spoke with Mellody DanceKeith and we are going to keep the appt the same and the PCP will get that order for the needed supplies.

## 2017-01-25 NOTE — Telephone Encounter (Signed)
Faxed DME order form to [employer]. They will contact patient to get scheduled and set up. No further action needed.

## 2017-01-25 NOTE — Telephone Encounter (Signed)
See previous message. I will close encounter. Advised wife Mellody DanceKeith at [facility] will send order. Nothing further is needed.

## 2017-01-25 NOTE — Telephone Encounter (Signed)
Mellody DanceKeith [facility] [facility] [phone]) returned phone call

## 2017-02-01 ENCOUNTER — Telehealth: Payer: Self-pay | Admitting: Family Medicine

## 2017-02-01 NOTE — Telephone Encounter (Signed)
Rayfield Citizenaroline with [employer] stopped by office to inform that per Medicare guidelines patient cannot get DME until a face to face visit with a provider before it can be done. Patient has appointment with [facility], on 03/08/17. Please advise.

## 2017-02-01 NOTE — Telephone Encounter (Signed)
FYI

## 2017-02-01 NOTE — Telephone Encounter (Signed)
Okay for visit

## 2017-02-17 ENCOUNTER — Telehealth: Payer: Self-pay | Admitting: Family Medicine

## 2017-02-17 NOTE — Telephone Encounter (Signed)
Patients wife called upset over DME order that had been placed. She expressed extreme frustration over the "incompetence" of the office and how this order had been handled. Patient's wife demanded to know how I was going to provide her with an immediate resolution to this.  I reviewed the entire timeline with her, from the initial HST up to now. I placed the patient's wife on hold and called [employer] on another line. After speaking with [employer], they informed me they would call the insurance to verify authorization, then would contact her to schedule delivery.  I informed patient's wife I would follow up with [employer] one more time, and then call her back to ensure we had reached a resolution.  After speaking with [employer] I confirmed that they had scheduled an appointment for home delivery on 02/20/17 at 1700.  Patient's wife was then called to confirm acknowledgement of appointment on 11/12, she confirmed with me.  No further action needed.

## 2017-02-20 DIAGNOSIS — Z79891 Long term (current) use of opiate analgesic: Secondary | ICD-10-CM | POA: Diagnosis not present

## 2017-02-20 DIAGNOSIS — F1121 Opioid dependence, in remission: Secondary | ICD-10-CM | POA: Diagnosis not present

## 2017-02-22 DIAGNOSIS — G4733 Obstructive sleep apnea (adult) (pediatric): Secondary | ICD-10-CM | POA: Diagnosis not present

## 2017-03-08 ENCOUNTER — Encounter: Payer: Self-pay | Admitting: Pulmonary Disease

## 2017-03-08 ENCOUNTER — Ambulatory Visit (INDEPENDENT_AMBULATORY_CARE_PROVIDER_SITE_OTHER): Payer: BLUE CROSS/BLUE SHIELD | Admitting: Pulmonary Disease

## 2017-03-08 DIAGNOSIS — G4730 Sleep apnea, unspecified: Secondary | ICD-10-CM

## 2017-03-08 NOTE — Patient Instructions (Signed)
You have severe  obstructive sleep apnea-he stopped breathing 98 times an hour  It is really important that you use your CPAP machine. We will set you up with a full facemask because you are a mouth breather  Call us back if your pressure is too high and we can adjust settings if needed

## 2017-03-08 NOTE — Progress Notes (Signed)
Subjective:    Patient ID: Grant Woods, male    DOB: 05/10/1970, 46 y.o.   MRN: [id number]  HPI  [age] year old man presents for evaluation of obstructive sleep apnea. He is recovering heroin addict and is on maintenance therapy with buprenorphine.  He reports excessive daytime somnolence and fatigue. Epworth sleepiness score is 18 and he reports sleepiness in various situations such as sitting and reading, watching TV, sitting inactive in a public place or in the afternoons  His wife has noted loud snoring and witnessed apneas-history was obtained from her over the phone. Bedtime is between 8:52 PM, sleep latency is minimal he is often falling asleep even prior to that, sleeps on his back with one pillow, reports 1-2 nocturnal awakenings for nocturia and is out of bed by 4 AM feeling tired with dryness of mouth but denies headaches.  There is no history suggestive of cataplexy, sleep paralysis or parasomnias  He underwent NP SG 11/2016 which showed severe OSA with AHI 98/hour and lowest desaturation of 84%.  CPAP did not control events and he was started on BiPAP and corrected with 16/11 with a large full facemask   Prescription was sent by his PCP office for auto CPAP -he obtained this about 2 weeks ago, full facemask minimal with claustrophobic and he has been unable to use it for more than an hour every night.  He denies pressure issues or dryness of mouth    Past Medical History:  Diagnosis Date  . Allergy   . Back pain   . Depression   . GERD (gastroesophageal reflux disease)   . Heroin use disorder, moderate, in sustained remission (HCC)   . MRSA (methicillin resistant Staphylococcus aureus)   . Opioid use disorder, moderate, on maintenance therapy [facility](HCC)     Past Surgical History:  Procedure Laterality Date  . CHOLECYSTECTOMY N/A 06/28/2016   Procedure: LAPAROSCOPIC CHOLECYSTECTOMY WITH INTRAOPERATIVE CHOLANGIOGRAM;  Surgeon: Rodman PickleLuke Aaron Kinsinger, MD;  [facility];   Service: General;  Laterality: N/A;  . KNEE ARTHROSCOPY Right     Allergies  Allergen Reactions  . Eggs Or Egg-Derived Products Anaphylaxis and Swelling  . Penicillins Hives    Has patient had a PCN reaction causing immediate rash, facial/tongue/throat swelling, SOB or lightheadedness with hypotension: unknown Has patient had a PCN reaction causing severe rash involving mucus membranes or skin necrosis: unknown Has patient had a PCN reaction that required hospitalization unknown Has patient had a PCN reaction occurring within the last 10 years: no If all of the above answers are "NO", then may proceed with Cephalosporin use.  Childhood allergy  . Dye Fdc Red [Red Dye]     Or yellow dye      Social History   Socioeconomic History  . Marital status: Single    Spouse name: Not on file  . Number of children: Not on file  . Years of education: Not on file  . Highest education level: Not on file  Social Needs  . Financial resource strain: Not on file  . Food insecurity - worry: Not on file  . Food insecurity - inability: Not on file  . Transportation needs - medical: Not on file  . Transportation needs - non-medical: Not on file  Occupational History  . Occupation: Warden/rangerhipping    Employer: Nurse, [employer]  Tobacco Use  . Smoking status: Current Every Day Smoker    Packs/day: 1.00  . Smokeless tobacco: Never Used  Substance and Sexual Activity  . Alcohol  use: No  . Drug use: No    Comment: in remission from heroin   . Sexual activity: Yes    Partners: Female    Birth control/protection: None  Other Topics Concern  . Not on file  Social History Narrative  . Not on file     Family History  Problem Relation Age of Onset  . Diabetes type II Mother   . Stroke Mother   . Hypertension Mother   . Diabetes type II Father        Past Surgical History:  Procedure Laterality Date  . CHOLECYSTECTOMY N/A 06/28/2016   Procedure: LAPAROSCOPIC CHOLECYSTECTOMY WITH  INTRAOPERATIVE CHOLANGIOGRAM;  Surgeon: De BlanchLuke Aaron Kinsinger, MD;  Location: [facility] OR;  Service: General;  Laterality: N/A;  . KNEE ARTHROSCOPY Right       Review of Systems Constitutional: negative for anorexia, fevers and sweats  Eyes: negative for irritation, redness and visual disturbance  Ears, nose, mouth, throat, and face: negative for earaches, epistaxis, nasal congestion and sore throat  Respiratory: negative for cough, dyspnea on exertion, sputum and wheezing  Cardiovascular: negative for chest pain, dyspnea, lower extremity edema, orthopnea, palpitations and syncope  Gastrointestinal: negative for abdominal pain, constipation, diarrhea, melena, nausea and vomiting  Genitourinary:negative for dysuria, frequency and hematuria  Hematologic/lymphatic: negative for bleeding, easy bruising and lymphadenopathy  Musculoskeletal:negative for arthralgias, muscle weakness and stiff joints  Neurological: negative for coordination problems, gait problems, headaches and weakness  Endocrine: negative for diabetic symptoms including polydipsia, polyuria and weight loss     Objective:   Physical Exam  Gen. Pleasant, well-nourished, in no distress, normal affect ENT - no lesions, no post nasal drip, class 2 airway Neck: No JVD, no thyromegaly, no carotid bruits Lungs: no use of accessory muscles, no dullness to percussion, clear without rales or rhonchi  Cardiovascular: Rhythm regular, heart sounds  normal, no murmurs or gallops, no peripheral edema Abdomen: soft and non-tender, no hepatosplenomegaly, BS normal. Musculoskeletal: No deformities, no cyanosis or clubbing Neuro:  alert, non focal       Assessment & Plan:

## 2017-03-08 NOTE — Assessment & Plan Note (Signed)
He was set up on auto CPAP with a full facemask because he is may even need a mouth breather We discussed desensitization technique  Call us back if your pressure is too high and we can adjust settings if needed May even need to to change to BiPAP settings He feels confident that with this new knowledge of how severe his condition is he would be able to adjust and will be using it more  Weight loss encouraged, compliance with goal of at least 4-6 hrs every night is the expectation. Advised against medications with sedative side effects Cautioned against driving when sleepy - understanding that sleepiness will vary on a day to day basis

## 2017-03-10 ENCOUNTER — Telehealth: Payer: Self-pay | Admitting: Pulmonary Disease

## 2017-03-10 DIAGNOSIS — G4733 Obstructive sleep apnea (adult) (pediatric): Secondary | ICD-10-CM

## 2017-03-10 NOTE — Telephone Encounter (Signed)
Received CPAP compliance from [employer]. Per RA, wants to change settings to auto cpap 10-18cm.   Spoke with patient's wife. She is aware of changes and will relay message to patient. Will go ahead and place order to [employer].   Nothing else needed at time of call.

## 2017-03-14 DIAGNOSIS — F1121 Opioid dependence, in remission: Secondary | ICD-10-CM | POA: Diagnosis not present

## 2017-03-14 DIAGNOSIS — Z79891 Long term (current) use of opiate analgesic: Secondary | ICD-10-CM | POA: Diagnosis not present

## 2017-03-22 DIAGNOSIS — Z79891 Long term (current) use of opiate analgesic: Secondary | ICD-10-CM | POA: Diagnosis not present

## 2017-03-22 DIAGNOSIS — F1121 Opioid dependence, in remission: Secondary | ICD-10-CM | POA: Diagnosis not present

## 2017-03-24 DIAGNOSIS — G4733 Obstructive sleep apnea (adult) (pediatric): Secondary | ICD-10-CM | POA: Diagnosis not present

## 2017-04-17 ENCOUNTER — Ambulatory Visit: Payer: BLUE CROSS/BLUE SHIELD | Admitting: Pulmonary Disease

## 2017-04-24 DIAGNOSIS — G4733 Obstructive sleep apnea (adult) (pediatric): Secondary | ICD-10-CM | POA: Diagnosis not present

## 2017-04-26 DIAGNOSIS — F1121 Opioid dependence, in remission: Secondary | ICD-10-CM | POA: Diagnosis not present

## 2017-04-26 DIAGNOSIS — Z79891 Long term (current) use of opiate analgesic: Secondary | ICD-10-CM | POA: Diagnosis not present

## 2017-04-27 ENCOUNTER — Ambulatory Visit: Payer: BLUE CROSS/BLUE SHIELD | Admitting: Pulmonary Disease

## 2017-04-28 ENCOUNTER — Ambulatory Visit (INDEPENDENT_AMBULATORY_CARE_PROVIDER_SITE_OTHER): Payer: BLUE CROSS/BLUE SHIELD | Admitting: Adult Health

## 2017-04-28 ENCOUNTER — Encounter: Payer: Self-pay | Admitting: Adult Health

## 2017-04-28 DIAGNOSIS — Z6830 Body mass index (BMI) 30.0-30.9, adult: Secondary | ICD-10-CM

## 2017-04-28 DIAGNOSIS — G4730 Sleep apnea, unspecified: Secondary | ICD-10-CM

## 2017-04-28 DIAGNOSIS — E6609 Other obesity due to excess calories: Secondary | ICD-10-CM | POA: Diagnosis not present

## 2017-04-28 NOTE — Addendum Note (Signed)
Addended by: Boone MasterJONES, JESSICA E on: 04/28/2017 05:20 PM   Modules accepted: Orders

## 2017-04-28 NOTE — Assessment & Plan Note (Signed)
Wt loss  

## 2017-04-28 NOTE — Assessment & Plan Note (Signed)
Needs improved CPAP compliance . Will adjust pressure and change mask to help with compliance   Plan  Patient Instructions  Change CPAP pressure 10-16 cm H2O.  Download in 1 month. Change to Nasal mask with chin strap .  Try to wear CPAP each night for at least 4 hours Do not drive a sleepy Work on healthy weight Follow-up with Grant Woods in 3 months and As needed

## 2017-04-28 NOTE — Patient Instructions (Addendum)
Change CPAP pressure 10-16 cm H2O.  Download in 1 month. Change to Nasal mask with chin strap .  Try to wear CPAP each night for at least 4 hours Do not drive a sleepy Work on healthy weight Follow-up with Dr. Vassie LollAlva in 3 months and As needed

## 2017-04-28 NOTE — Progress Notes (Signed)
@Patient  ID: Grant Woods, male    DOB: November 08, 1970, 47 y.o.   MRN: [id number]  Chief Complaint  Patient presents with  . Follow-up    OSA     Referring provider: Helane Rima, DO  HPI: 47 year old male followed for severe obstructive sleep apnea Past medical history positive for recovering heroin addict  TEST  NPSG  11/2016 which showed severe OSA with AHI 98/hour and lowest desaturation of 84%.  CPAP did not control events and he was started on BiPAP and corrected with 16/11 with a large full facemask   04/28/2017 Follow up: OSA  Patient returns for a follow-up for sleep apnea.  Patient was seen last visit for severe sleep apnea. He is supposed to be wearing CPAP at bedtime.  Patient says he has a hard time wearing it feels like the pressure is too high.  Download shows limited use with 66%.  On average using about 2 hours.  AHI 11.1.  Patient is on AutoSet 10-18 cm H2O . Is trying to wear but feels it is causing him to be too bloated. We discussed options to lower pressure and mask options .       Allergies  Allergen Reactions  . Eggs Or Egg-Derived Products Anaphylaxis and Swelling  . Penicillins Hives    Has patient had a PCN reaction causing immediate rash, facial/tongue/throat swelling, SOB or lightheadedness with hypotension: unknown Has patient had a PCN reaction causing severe rash involving mucus membranes or skin necrosis: unknown Has patient had a PCN reaction that required hospitalization unknown Has patient had a PCN reaction occurring within the last 10 years: no If all of the above answers are "NO", then may proceed with Cephalosporin use.  Childhood allergy  . Dye Fdc Red [Red Dye]     Or yellow dye     There is no immunization history on file for this patient.  Past Medical History:  Diagnosis Date  . Allergy   . Back pain   . Depression   . GERD (gastroesophageal reflux disease)   . Heroin use disorder, moderate, in sustained remission  (HCC)   . MRSA (methicillin resistant Staphylococcus aureus)   . Opioid use disorder, moderate, on maintenance therapy (HCC)     Tobacco History: Social History   Tobacco Use  Smoking Status Current Every Day Smoker  . Packs/day: 1.00  Smokeless Tobacco Never Used   Ready to quit: Not Answered Counseling given: Not Answered   Outpatient Encounter Medications as of 04/28/2017  Medication Sig  . albuterol (PROVENTIL HFA;VENTOLIN HFA) 108 (90 Base) MCG/ACT inhaler Inhale 2 puffs into the lungs every 6 (six) hours as needed for wheezing or shortness of breath.  . Buprenorphine HCl-Naloxone HCl (ZUBSOLV) 5.7-1.4 MG SUBL Place 1 tablet under the tongue 2 (two) times daily. 6 AM and 4 PM  . calcium-vitamin D (OSCAL WITH D) 500-200 MG-UNIT tablet Take 1 tablet by mouth daily with breakfast.  . CREATINE PO Take 1 tablet by mouth daily as needed (supplement).  . Diclofenac Sodium (PENNSAID) 2 % SOLN Place 1 application onto the skin 2 (two) times daily.  Marland Kitchen ibuprofen (ADVIL,MOTRIN) 800 MG tablet Take 1 tablet (800 mg total) by mouth every 8 (eight) hours as needed.  . Multiple Vitamin (MULTIVITAMIN WITH MINERALS) TABS tablet Take 1 tablet by mouth daily.  Marland Kitchen POTASSIUM GLUCONATE PO Take 1 tablet by mouth daily.   No facility-administered encounter medications on file as of 04/28/2017.      Review of  Systems  Constitutional:   No  weight loss, night sweats,  Fevers, chills, fatigue, or  lassitude.  HEENT:   No headaches,  Difficulty swallowing,  Tooth/dental problems, or  Sore throat,                No sneezing, itching, ear ache, nasal congestion, post nasal drip,   CV:  No chest pain,  Orthopnea, PND, swelling in lower extremities, anasarca, dizziness, palpitations, syncope.   GI  No heartburn, indigestion, abdominal pain, nausea, vomiting, diarrhea, change in bowel habits, loss of appetite, bloody stools.   Resp: No shortness of breath with exertion or at rest.  No excess mucus, no  productive cough,  No non-productive cough,  No coughing up of blood.  No change in color of mucus.  No wheezing.  No chest wall deformity  Skin: no rash or lesions.  GU: no dysuria, change in color of urine, no urgency or frequency.  No flank pain, no hematuria   MS:  No joint pain or swelling.  No decreased range of motion.  No back pain.    Physical Exam  BP 126/74 (BP Location: Left Arm, Patient Position: Sitting, Cuff Size: Normal)   Pulse 77   Ht 6\' 1"  (1.854 m)   Wt 246 lb 9.6 oz (111.9 kg)   SpO2 97%   BMI 32.53 kg/m   GEN: A/Ox3; pleasant , NAD, obese    HEENT:  [place]/AT,  EACs-clear, TMs-wnl, NOSE-clear, THROAT-clear, no lesions, no postnasal drip or exudate noted. Class 2-3 MP airway   NECK:  Supple w/ fair ROM; no JVD; normal carotid impulses w/o bruits; no thyromegaly or nodules palpated; no lymphadenopathy.    RESP  Clear  P & A; w/o, wheezes/ rales/ or rhonchi. no accessory muscle use, no dullness to percussion  CARD:  RRR, no m/r/g, no peripheral edema, pulses intact, no cyanosis or clubbing.  GI:   Soft & nt; nml bowel sounds; no organomegaly or masses detected.   Musco: Warm bil, no deformities or joint swelling noted.   Neuro: alert, no focal deficits noted.    Skin: Warm, no lesions or rashes    Lab Results:  CBC   BMET   BNP No results found for: BNP  ProBNP No results found for: PROBNP  Imaging: No results found.   Assessment & Plan:   Severe sleep apnea Needs improved CPAP compliance . Will adjust pressure and change mask to help with compliance   Plan  Patient Instructions  Change CPAP pressure 10-16 cm H2O.  Download in 1 month. Change to Nasal mask with chin strap .  Try to wear CPAP each night for at least 4 hours Do not drive a sleepy Work on healthy weight Follow-up with Dr. Vassie LollAlva in 3 months and As needed       Class 1 obesity due to excess calories without serious comorbidity with body mass index (BMI) of 30.0 to  30.9 in adult Wt loss .      Rubye Oaksammy Ben Sanz, NP 04/28/2017

## 2017-05-17 DIAGNOSIS — F1121 Opioid dependence, in remission: Secondary | ICD-10-CM | POA: Diagnosis not present

## 2017-05-17 DIAGNOSIS — Z79891 Long term (current) use of opiate analgesic: Secondary | ICD-10-CM | POA: Diagnosis not present

## 2017-06-07 DIAGNOSIS — Z79899 Other long term (current) drug therapy: Secondary | ICD-10-CM | POA: Diagnosis not present

## 2017-07-11 NOTE — Progress Notes (Signed)
Reviewed & agree with plan  

## 2017-07-12 DIAGNOSIS — Z79899 Other long term (current) drug therapy: Secondary | ICD-10-CM | POA: Diagnosis not present

## 2017-08-03 ENCOUNTER — Encounter (HOSPITAL_COMMUNITY): Payer: Self-pay

## 2017-08-03 ENCOUNTER — Emergency Department (HOSPITAL_COMMUNITY)
Admission: EM | Admit: 2017-08-03 | Discharge: 2017-08-03 | Disposition: A | Discharge status: Home / Self Care | Payer: BLUE CROSS/BLUE SHIELD | Attending: Emergency Medicine | Admitting: Emergency Medicine

## 2017-08-03 DIAGNOSIS — Z5321 Procedure and treatment not carried out due to patient leaving prior to being seen by health care provider: Secondary | ICD-10-CM | POA: Insufficient documentation

## 2017-08-03 DIAGNOSIS — R1110 Vomiting, unspecified: Secondary | ICD-10-CM | POA: Diagnosis not present

## 2017-08-03 DIAGNOSIS — R1084 Generalized abdominal pain: Secondary | ICD-10-CM | POA: Insufficient documentation

## 2017-08-03 LAB — COMPREHENSIVE METABOLIC PANEL
ALBUMIN: 4.7 g/dL (ref 3.5–5.0)
ALK PHOS: 59 U/L (ref 38–126)
ALT: 25 U/L (ref 17–63)
ANION GAP: 10 (ref 5–15)
AST: 21 U/L (ref 15–41)
BILIRUBIN TOTAL: 0.2 mg/dL — AB (ref 0.3–1.2)
BUN: 20 mg/dL (ref 6–20)
CALCIUM: 9.6 mg/dL (ref 8.9–10.3)
CO2: 27 mmol/L (ref 22–32)
Chloride: 104 mmol/L (ref 101–111)
Creatinine, Ser: 0.92 mg/dL (ref 0.61–1.24)
GFR calc Af Amer: >60 mL/min (ref >60)
GLUCOSE: 92 mg/dL (ref 65–99)
POTASSIUM: 3.5 mmol/L (ref 3.5–5.1)
Sodium: 141 mmol/L (ref 135–145)
TOTAL PROTEIN: 7.9 g/dL (ref 6.5–8.1)

## 2017-08-03 LAB — CBC
HCT: 38.4 % — ABNORMAL LOW (ref 39.0–52.0)
HEMOGLOBIN: 13.2 g/dL (ref 13.0–17.0)
MCH: 29.3 pg (ref 26.0–34.0)
MCHC: 34.4 g/dL (ref 30.0–36.0)
MCV: 85.1 fL (ref 78.0–100.0)
Platelets: 338 10*3/uL (ref 150–400)
RBC: 4.51 MIL/uL (ref 4.22–5.81)
RDW: 13.7 % (ref 11.5–15.5)
WBC: 10.8 10*3/uL — AB (ref 4.0–10.5)

## 2017-08-03 LAB — LIPASE, BLOOD: Lipase: 29 U/L (ref 11–51)

## 2017-08-03 MED ORDER — ONDANSETRON 4 MG PO TBDP
4.0000 mg | ORAL_TABLET | Freq: Once | ORAL | Status: AC | PRN
  Administered 2017-08-03: 4 mg via ORAL
  Filled 2017-08-03 (×2): qty 1

## 2017-08-03 NOTE — ED Triage Notes (Signed)
Pt called no answer 

## 2017-08-03 NOTE — ED Triage Notes (Signed)
Pt is alert and oriented x 4 and is escorted with and reports that pt is having generalized abdominal pain 10/10 cramping/ pressure / with associated vomiting of blood x 1 lage episode today. Pt denies diarrhea and blood in stool last BM 4AM . Pt reports that after BM last night that he felt relief, Pt reports that his abdomen was hard

## 2017-08-03 NOTE — ED Notes (Signed)
Called Pt in lobby for vital recheck, no response in lobby x1. 

## 2017-11-07 ENCOUNTER — Telehealth: Payer: Self-pay

## 2017-11-07 NOTE — Telephone Encounter (Signed)
Copied from CRM [id number]. Topic: Referral - Request >> Nov 06, 2017  8:48 AM Gean BirchwoodWilliams-Neal, Sade R wrote: Pts wife is calling in for a request to pulmonary for  CPAP or to reinstate due to pt being out of compliance.  CB# [phone]  Called wife just needed new order faxed to [employer]. I have sent order and let wife know. She will call if any problems.

## 2017-11-13 NOTE — Telephone Encounter (Signed)
tanisha calling from [employer] would like office notes faxed over. Please advise Fax [phone]

## 2017-11-14 NOTE — Telephone Encounter (Signed)
Last note from pulmonology faxed. To number provided

## 2017-11-17 DIAGNOSIS — G4733 Obstructive sleep apnea (adult) (pediatric): Secondary | ICD-10-CM | POA: Diagnosis not present

## 2017-12-18 DIAGNOSIS — G4733 Obstructive sleep apnea (adult) (pediatric): Secondary | ICD-10-CM | POA: Diagnosis not present

## 2017-12-27 DIAGNOSIS — Z79891 Long term (current) use of opiate analgesic: Secondary | ICD-10-CM | POA: Diagnosis not present

## 2018-01-17 DIAGNOSIS — G4733 Obstructive sleep apnea (adult) (pediatric): Secondary | ICD-10-CM | POA: Diagnosis not present

## 2018-02-17 DIAGNOSIS — G4733 Obstructive sleep apnea (adult) (pediatric): Secondary | ICD-10-CM | POA: Diagnosis not present

## 2018-02-21 DIAGNOSIS — Z79891 Long term (current) use of opiate analgesic: Secondary | ICD-10-CM | POA: Diagnosis not present

## 2018-03-19 DIAGNOSIS — G4733 Obstructive sleep apnea (adult) (pediatric): Secondary | ICD-10-CM | POA: Diagnosis not present

## 2018-03-27 DIAGNOSIS — Z79891 Long term (current) use of opiate analgesic: Secondary | ICD-10-CM | POA: Diagnosis not present

## 2018-04-19 DIAGNOSIS — G4733 Obstructive sleep apnea (adult) (pediatric): Secondary | ICD-10-CM | POA: Diagnosis not present

## 2018-05-20 DIAGNOSIS — G4733 Obstructive sleep apnea (adult) (pediatric): Secondary | ICD-10-CM | POA: Diagnosis not present

## 2018-06-18 DIAGNOSIS — G4733 Obstructive sleep apnea (adult) (pediatric): Secondary | ICD-10-CM | POA: Diagnosis not present

## 2018-06-19 DIAGNOSIS — Z79899 Other long term (current) drug therapy: Secondary | ICD-10-CM | POA: Diagnosis not present

## 2018-06-23 IMAGING — DX DG CHEST 2V
2 series · 2 of 2 positions shown · non-contrast
Comparison: Chest radiograph 06/08/2014

CLINICAL DATA: Vomiting and chills

EXAM:
CHEST  2 VIEW

[chest pa]
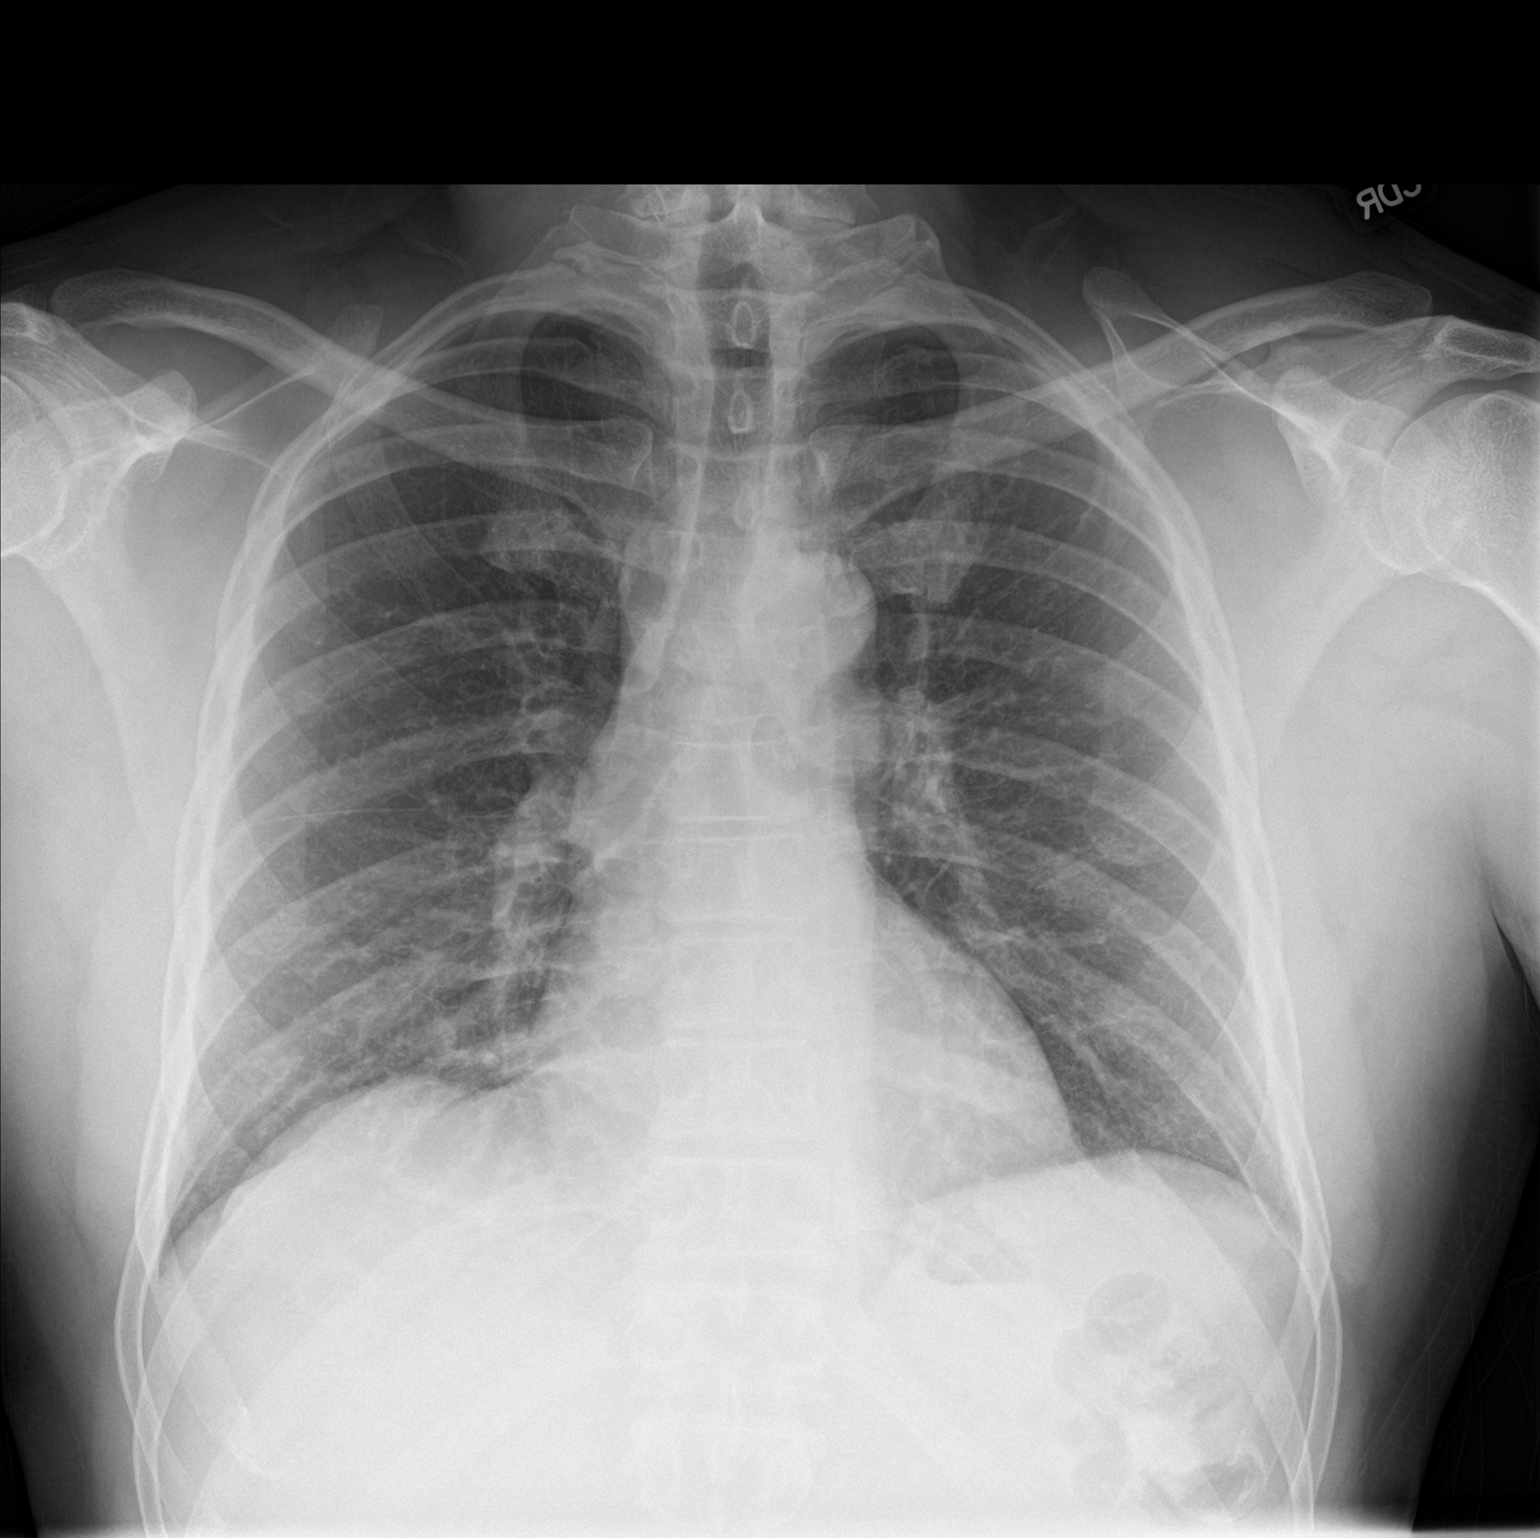

[chest lat]
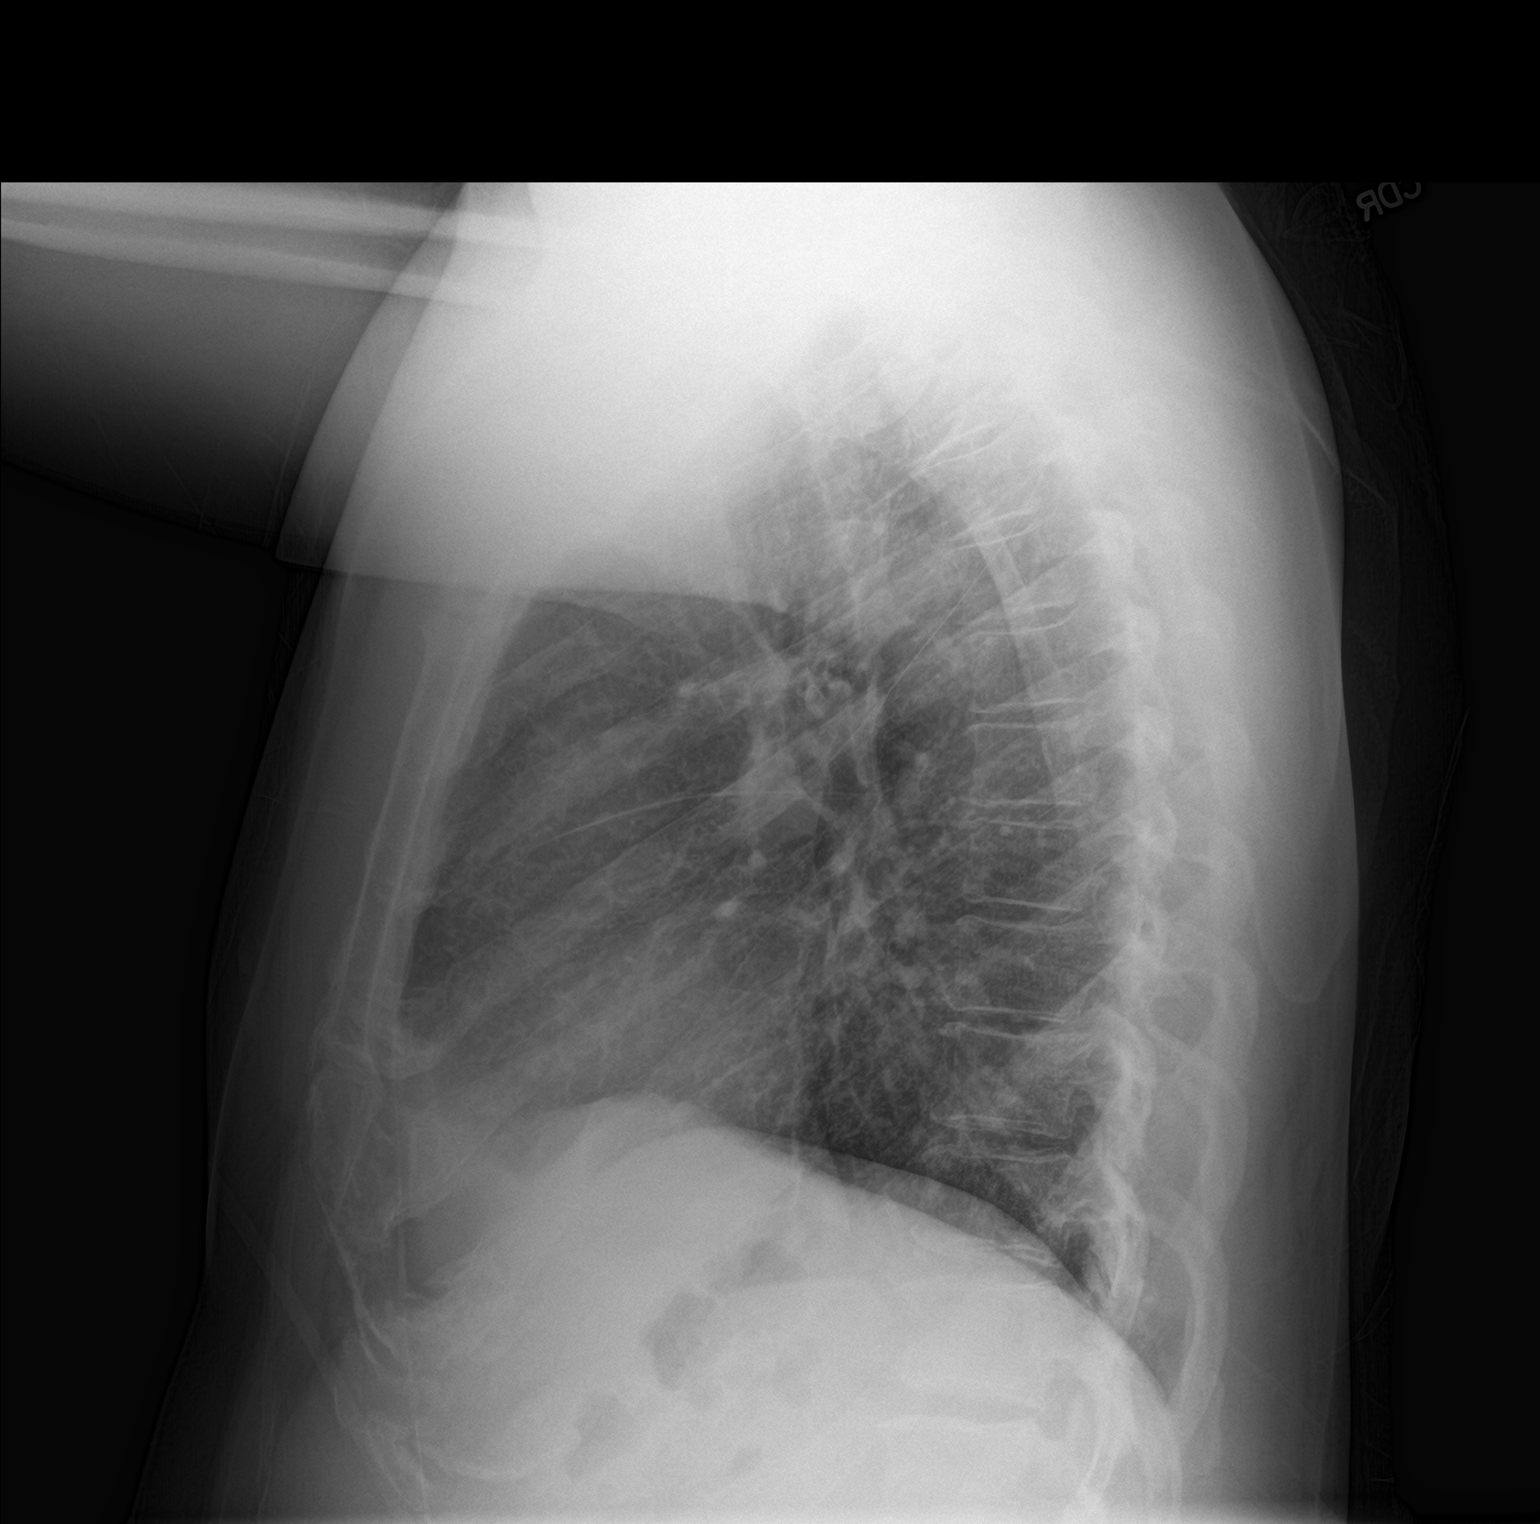

[2 of 2 positions shown; findings below may reference images not displayed]

FINDINGS: The heart size and mediastinal contours are within normal limits.
There is basilar predominant peribronchial thickening. No focal
consolidation or pulmonary edema. No pleural effusion or
pneumothorax. The visualized skeletal structures are unremarkable.
IMPRESSION: Basilar predominant peribronchial thickening may indicate
bronchitis. Otherwise, no active cardiopulmonary disease.

## 2018-07-16 ENCOUNTER — Telehealth: Payer: Self-pay | Admitting: Family Medicine

## 2018-07-16 DIAGNOSIS — Z79891 Long term (current) use of opiate analgesic: Secondary | ICD-10-CM | POA: Diagnosis not present

## 2018-07-16 DIAGNOSIS — Z79899 Other long term (current) drug therapy: Secondary | ICD-10-CM | POA: Diagnosis not present

## 2018-07-16 NOTE — Telephone Encounter (Signed)
Called pt and left vm to schedule f.u. Pt has not been seen in over a year.

## 2018-07-19 DIAGNOSIS — G4733 Obstructive sleep apnea (adult) (pediatric): Secondary | ICD-10-CM | POA: Diagnosis not present

## 2018-08-18 DIAGNOSIS — G4733 Obstructive sleep apnea (adult) (pediatric): Secondary | ICD-10-CM | POA: Diagnosis not present

## 2018-08-28 DIAGNOSIS — Z79899 Other long term (current) drug therapy: Secondary | ICD-10-CM | POA: Diagnosis not present

## 2018-10-11 DIAGNOSIS — Z79899 Other long term (current) drug therapy: Secondary | ICD-10-CM | POA: Diagnosis not present

## 2018-10-11 DIAGNOSIS — F1120 Opioid dependence, uncomplicated: Secondary | ICD-10-CM | POA: Diagnosis not present

## 2018-11-10 DIAGNOSIS — F17210 Nicotine dependence, cigarettes, uncomplicated: Secondary | ICD-10-CM | POA: Diagnosis not present

## 2018-11-10 DIAGNOSIS — Z79899 Other long term (current) drug therapy: Secondary | ICD-10-CM | POA: Diagnosis not present

## 2018-11-21 ENCOUNTER — Encounter: Payer: Self-pay | Admitting: Family Medicine

## 2018-11-21 ENCOUNTER — Ambulatory Visit (INDEPENDENT_AMBULATORY_CARE_PROVIDER_SITE_OTHER): Payer: BC Managed Care – PPO | Admitting: Family Medicine

## 2018-11-21 ENCOUNTER — Telehealth: Payer: Self-pay

## 2018-11-21 ENCOUNTER — Other Ambulatory Visit: Payer: Self-pay

## 2018-11-21 VITALS — BP 134/80 | HR 75 | Temp 98.1°F | Ht 74.0 in | Wt 229.1 lb

## 2018-11-21 DIAGNOSIS — K1120 Sialoadenitis, unspecified: Secondary | ICD-10-CM

## 2018-11-21 DIAGNOSIS — R0789 Other chest pain: Secondary | ICD-10-CM | POA: Diagnosis not present

## 2018-11-21 MED ORDER — CEPHALEXIN 500 MG PO CAPS: 500.0000 mg | ORAL_CAPSULE | Freq: Four times a day (QID) | ORAL | 0 refills | Status: DC

## 2018-11-21 NOTE — Progress Notes (Signed)
Subjective:     Patient ID: Grant Woods, male   DOB: 1970-06-27, 48 y.o.   MRN: [id number]  HPI Patient is seen with "swollen gland "right submandibular region.  1 day duration.  He states that he was eating earlier today and had what he described as a "burst of fluid "inside his mouth.  He has some obvious visible swelling of the neck submandibular region.  No fevers or chills.  No prior history of known obstructed duct.  No recent sore throat symptoms.  He has not noted any recent neck adenopathy.  Does smoke.  Patient also relates very fleeting chest pain which was a sharp prickly type pain lasting only a couple seconds couple times earlier today.  He works as a Estate agentforklift operator.  No exacerbating factors.  No dyspnea.  Denies any cough, fever, skin rash, neck pain, or any exertional symptoms.  Not sore to touch.  No GERD symptoms.  Past Medical History:  Diagnosis Date  . Allergy   . Back pain   . Depression   . GERD (gastroesophageal reflux disease)   . Heroin use disorder, moderate, in sustained remission (HCC)   . MRSA (methicillin resistant Staphylococcus aureus)   . Opioid use disorder, moderate, on maintenance therapy [facility](HCC)    Past Surgical History:  Procedure Laterality Date  . CHOLECYSTECTOMY N/A 06/28/2016   Procedure: LAPAROSCOPIC CHOLECYSTECTOMY WITH INTRAOPERATIVE CHOLANGIOGRAM;  Surgeon: Rodman PickleLuke Aaron Kinsinger, MD;  [facility];  Service: General;  Laterality: N/A;  . KNEE ARTHROSCOPY Right     reports that he has been smoking. He has been smoking about 1.00 pack per day. He has never used smokeless tobacco. He reports that he does not drink alcohol or use drugs. family history includes Diabetes type II in his father and mother; Hypertension in his mother; Stroke in his mother. Allergies  Allergen Reactions  . Eggs Or Egg-Derived Products Anaphylaxis and Swelling  . Penicillins Hives    Has patient had a PCN reaction causing immediate rash, facial/tongue/throat  swelling, SOB or lightheadedness with hypotension: unknown Has patient had a PCN reaction causing severe rash involving mucus membranes or skin necrosis: unknown Has patient had a PCN reaction that required hospitalization unknown Has patient had a PCN reaction occurring within the last 10 years: no If all of the above answers are "NO", then may proceed with Cephalosporin use.  Childhood allergy  . Dye Fdc Red [Red Dye]     Or yellow dye     Review of Systems  Constitutional: Negative for appetite change, chills, fever and unexpected weight change.  HENT: Negative for facial swelling, sore throat and trouble swallowing.   Respiratory: Negative for cough, shortness of breath and wheezing.   Cardiovascular: Negative for leg swelling.  Gastrointestinal: Negative for abdominal pain.  Neurological: Negative for dizziness and syncope.  Hematological: Negative for adenopathy.       Objective:   Physical Exam Constitutional:      General: He is not in acute distress.    Appearance: He is well-developed. He is not ill-appearing.  HENT:     Head:     Comments: Oropharynx is clear.  No gum erythema or swelling    Right Ear: Tympanic membrane, ear canal and external ear normal.     Left Ear: Tympanic membrane, ear canal and external ear normal.     Mouth/Throat:     Mouth: Mucous membranes are moist.     Pharynx: Oropharynx is clear. No oropharyngeal exudate or posterior  oropharyngeal erythema.  Neck:     Musculoskeletal: Neck supple.     Comments: He has some obvious swelling of the right submandibular region.  No warmth.  No erythema.  Minimally tender to palpation Cardiovascular:     Rate and Rhythm: Normal rate and regular rhythm.     Heart sounds: No murmur. No friction rub. No gallop.   Pulmonary:     Effort: Pulmonary effort is normal.     Breath sounds: Normal breath sounds.  Musculoskeletal:     Right lower leg: No edema.     Left lower leg: No edema.  Lymphadenopathy:      Cervical: No cervical adenopathy.  Neurological:     Mental Status: He is alert.        Assessment:     #1 one day history of acute right submandibular sialadenitis  #2 very fleeting atypical chest symptoms lasting only seconds-?neuropathic.    Plan:     -We recommended sialagogues such as lemon candy to promote salivation and stressed importance of good hydration. -Reviewed signs and symptoms of salivary gland infection such as increased erythema or warmth.  We elected to go and cover with Keflex 500 mg 4 times daily for 7 days -Recommend follow-up with primary within 2 weeks if symptoms not resolving -Strongly encouraged to discontinue smoking -We reviewed red flags for chest pain such as dyspnea, exertional pain, more prolonged pain, etc.  His symptoms are very atypical lasting only a couple seconds.  Observe for now  Eulas Post MD [facility]

## 2018-11-21 NOTE — Telephone Encounter (Signed)
Patient states his symptoms began yesterday.  States he was eating and felt a "burst of something" in his mouth".  States he has been wearing a mask and practicing social distancing.  States the glands in his neck feel swollen today.  No fever, nausea, vomiting, chills.  No diarrhea.  No cough.  No COVID symptoms or contact.  No sore throat or difficulty swallowing.  Normal appetite.    Some mild pains in his chest that came and went.  He had this happen before in 09/2016 and had negative EKG.  It was determined to be gastritis.  No difficulty breathing, chest tightness, lightheadedness, or dizziness.  Pain is very mild and not constant.    Patient scheduled with Dr. Elease Hashimoto at 3:30.  Patient has been notified to wait in his car when arriving to facility and to call number provided.  Patient verbalized understanding.

## 2018-11-21 NOTE — Patient Instructions (Signed)
Salivary Gland Infection  A salivary gland infection is an infection in one or more of the glands that produce saliva. You have six major salivary glands. Each gland has a duct that carries saliva into your mouth. Saliva keeps your mouth moist and breaks down the food that you eat. It also helps prevent tooth decay. Two salivary glands are located just in front of your ears (parotid). The ducts for these glands open up inside your cheeks, near your back teeth. You also have two glands under your tongue (sublingual) and two glands under your jaw (submandibular). The ducts for these glands open under your tongue. Any salivary gland can become infected. Most infections occur in the parotid glands or submandibular glands. What are the causes? This condition may be caused by bacteria or viruses.  The bacteria that cause salivary gland infections are usually the same bacteria that normally live in your mouth. ? A stone can form in a salivary gland and block the flow of saliva. As a result, saliva backs up into the salivary gland. Bacteria may then start to grow behind the blockage and cause infection. ? Bacterial infections usually cause pain and swelling on one side of the face. Submandibular gland swelling occurs under the jaw. Parotid swelling occurs in front of the ear. ? Bacterial infections are more common in adults.  The mumps virus is the most common cause of viral salivary gland infections. However, mumps is now rare because of vaccination. ? This infection causes swelling in both parotid glands. ? Viral infections are more common in children. What increases the risk? The following factors may make you more likely to develop a bacterial infection:  Not taking good care of your mouth and teeth (poor oral hygiene).  Smoking.  Not drinking enough water.  Having a disease that causes dry mouth and dry eyes (Mikulicz syndrome or Sjogren syndrome). A viral infection is more likely to occur in  children who do not get the MMR (measles, mumps, rubella) vaccine. What are the signs or symptoms? The main sign of a salivary gland infection is a swollen salivary gland. This type of inflammation is often called sialadenitis. You may have swelling in front of your ear, under your jaw, or under your tongue. Swelling may get worse when you eat and decrease after you eat. Other signs and symptoms include:  Pain.  Tenderness.  Redness.  Dry mouth.  Bad taste in your mouth.  Difficulty chewing and swallowing.  Fever. How is this diagnosed? This condition may be diagnosed based on:  Your signs and symptoms.  A physical exam. During the exam, your health care provider will look and feel inside your mouth to see whether a stone is blocking a salivary gland duct.  Tests, such as: ? An X-ray to check for a stone. ? An ultrasound, CT scan, or MRI to look for an abscess and to rule out other causes of swelling. ? A culture and sensitivity test. In this test, a sample of pus is taken from the salivary gland with a swab or by using a needle (aspiration). The sample is tested in a lab to determine the type of bacteria that is growing and which antibiotic medicines will work against it. You may need to see an ear, nose, and throat specialist (ENT or otolaryngologist) for diagnosis and treatment. How is this treated? Viral salivary gland infections usually clear up without treatment. Bacterial infections are usually treated with antibiotic medicine. Severe infections that cause difficulty with swallowing   may be treated with an IV antibiotic in the hospital. Other treatments may include:  Probing and widening the salivary duct to allow a stone to pass. In some cases, a thin, flexible scope (endoscope) may be inserted into the duct to find a stone and remove it.  Breaking up a stone using sound waves.  Draining an infected gland (abscess) with a needle.  Surgery to: ? Remove a stone. ? Drain  pus from an abscess. ? Remove a badly infected gland. Follow these instructions at home:  Medicines  Take over-the-counter and prescription medicines only as told by your health care provider.  If you were prescribed an antibiotic medicine, take it as told by your health care provider. Do not stop taking the antibiotic even if you start to feel better. To relieve discomfort  Follow these instructions every few hours: ? Suck on a lemon candy to stimulate the flow of saliva. ? Put a warm compress over the gland. ? Gently massage the gland.  Rinse your mouth with a salt-water mixture 3-4 times a day or as needed. To make a salt-water mixture, completely dissolve -1 tsp of salt in 1 cup of warm water. General instructions  Practice good oral hygiene by brushing and flossing your teeth after meals and before you go to bed.  Drink enough fluid to keep your urine pale yellow.  Do not use any products that contain nicotine or tobacco, such as cigarettes and e-cigarettes. If you need help quitting, ask your health care provider.  Keep all follow-up visits as told by your health care provider. This is important. Contact a health care provider if:  You have pain and swelling in your face, jaw, or mouth after eating.  You have persistent swelling in any of these places: ? In front of your ear. ? Under your jaw. ? Inside your mouth. Get help right away if:  You have pain and swelling in your face, jaw, or mouth, and this is getting worse.  Your pain and swelling make it hard to swallow or breathe. Summary  A salivary gland infection is an infection in one or more of the glands that produce saliva. You have six major salivary glands. Each gland has a duct that carries saliva into your mouth.  Any salivary gland can become infected. Most infections occur in the glands just in front of your ears (parotid glands) or the glands under your jaw (submandibular glands).  This condition may be  caused by bacteria or viruses.  Salivary gland infections caused by a virus usually clear up without treatment. Bacterial infections are usually treated with antibiotic medicine. This information is not intended to replace advice given to you by your health care provider. Make sure you discuss any questions you have with your health care provider. Document Released: 05/05/2004 Document Revised: 04/26/2017 Document Reviewed: 04/26/2017 Elsevier Patient Education  2020 [employer].

## 2018-11-21 NOTE — Telephone Encounter (Signed)
Patient has in office appointment today at 3:30pm.

## 2018-11-21 NOTE — Telephone Encounter (Signed)
See note. Patient scheduled at 3:30.

## 2018-11-22 ENCOUNTER — Emergency Department (HOSPITAL_BASED_OUTPATIENT_CLINIC_OR_DEPARTMENT_OTHER)
Admission: EM | Admit: 2018-11-22 | Discharge: 2018-11-22 | Disposition: A | Discharge status: Home / Self Care | Payer: BC Managed Care – PPO | Attending: Emergency Medicine | Admitting: Emergency Medicine

## 2018-11-22 ENCOUNTER — Encounter (HOSPITAL_BASED_OUTPATIENT_CLINIC_OR_DEPARTMENT_OTHER): Payer: Self-pay

## 2018-11-22 ENCOUNTER — Other Ambulatory Visit: Payer: Self-pay

## 2018-11-22 DIAGNOSIS — F17210 Nicotine dependence, cigarettes, uncomplicated: Secondary | ICD-10-CM | POA: Diagnosis not present

## 2018-11-22 DIAGNOSIS — K1120 Sialoadenitis, unspecified: Secondary | ICD-10-CM

## 2018-11-22 DIAGNOSIS — Z791 Long term (current) use of non-steroidal anti-inflammatories (NSAID): Secondary | ICD-10-CM | POA: Diagnosis not present

## 2018-11-22 DIAGNOSIS — R220 Localized swelling, mass and lump, head: Secondary | ICD-10-CM | POA: Diagnosis not present

## 2018-11-22 DIAGNOSIS — Z79899 Other long term (current) drug therapy: Secondary | ICD-10-CM | POA: Diagnosis not present

## 2018-11-22 MED ORDER — IBUPROFEN 800 MG PO TABS: 800.0000 mg | ORAL_TABLET | Freq: Three times a day (TID) | ORAL | 0 refills | Status: DC

## 2018-11-22 MED ORDER — IBUPROFEN 800 MG PO TABS
800.0000 mg | ORAL_TABLET | Freq: Once | ORAL | Status: AC
  Administered 2018-11-22: 800 mg via ORAL
  Filled 2018-11-22: qty 1

## 2018-11-22 MED ORDER — AMOXICILLIN-POT CLAVULANATE 875-125 MG PO TABS: 1.0000 | ORAL_TABLET | Freq: Two times a day (BID) | ORAL | 0 refills | Status: DC

## 2018-11-22 NOTE — ED Triage Notes (Signed)
Pt c/o increase in swelling to right jaw area-states he was dx with salivary gland infection by PCP yesterday-started on abx-first dose just prior to arrival-NAD-steady gait

## 2018-11-22 NOTE — ED Provider Notes (Signed)
[facility] [facility] Provider Note   CSN: [id number] Arrival date & time: 11/22/18  1348    History   Chief Complaint Chief Complaint  Patient presents with  . Facial Swelling    HPI Grant Woods is a 48 y.o. male.     HPI Patient reports he suddenly started getting a pain and large swelling in his right jaw area approximately 36 hours ago.  It just came on kind of suddenly as a sharp pain and then he started getting swollen.  He reports it went down a bit and then got swollen again and very painful.  He saw his doctor yesterday and got prescribed antibiotics of Keflex.  He reports he just filled them has not started them yet but reports that the swelling again got pretty large and painful.  No difficulty swallowing or breathing.  No fevers no chills. Past Medical History:  Diagnosis Date  . Allergy   . Back pain   . Depression   . GERD (gastroesophageal reflux disease)   . Heroin use disorder, moderate, in sustained remission ([place])   . MRSA (methicillin resistant Staphylococcus aureus)   . Opioid use disorder, moderate, on maintenance therapy [facility])     Patient Active Problem List   Diagnosis Date Noted  . Right knee pain 09/22/2016  . Chest pain 09/22/2016  . Calculus of gallbladder without cholecystitis without obstruction   . Elevated bilirubin   . Pancreatitis 06/26/2016  . Acid reflux 06/26/2016  . Primary osteoarthritis of right knee 06/09/2016  . Achilles tendinitis of both lower extremities 06/09/2016  . Class 1 obesity due to excess calories without serious comorbidity with body mass index (BMI) of 30.0 to 30.9 in adult 06/09/2016  . Current every day smoker 06/09/2016  . Severe sleep apnea 06/09/2016  . Heroin use disorder, moderate, in sustained remission ([place])   . Opioid use disorder, moderate, on maintenance therapy [facility])     Past Surgical History:  Procedure Laterality Date  . CHOLECYSTECTOMY N/A 06/28/2016   Procedure:  LAPAROSCOPIC CHOLECYSTECTOMY WITH INTRAOPERATIVE CHOLANGIOGRAM;  Surgeon: Mickeal Skinner, MD;  Location: [facility] OR;  Service: General;  Laterality: N/A;  . KNEE ARTHROSCOPY Right         Home Medications    Prior to Admission medications   Medication Sig Start Date End Date Taking? Authorizing Provider  albuterol (PROVENTIL HFA;VENTOLIN HFA) 108 (90 Base) MCG/ACT inhaler Inhale 2 puffs into the lungs every 6 (six) hours as needed for wheezing or shortness of breath. 06/09/16   Briscoe Deutscher, DO  amoxicillin-clavulanate (AUGMENTIN) 875-125 MG tablet Take 1 tablet by mouth 2 (two) times daily. One po bid x 7 days 11/22/18   Charlesetta Shanks, MD  Buprenorphine HCl-Naloxone HCl (ZUBSOLV) 5.7-1.4 MG SUBL Place 1 tablet under the tongue 2 (two) times daily. 6 AM and 4 PM    [provider]  calcium-vitamin D (OSCAL WITH D) 500-200 MG-UNIT tablet Take 1 tablet by mouth daily with breakfast.    [provider]  cephALEXin (KEFLEX) 500 MG capsule Take 1 capsule (500 mg total) by mouth 4 (four) times daily. 11/21/18   Burchette, Alinda Sierras, MD  CREATINE PO Take 1 tablet by mouth daily as needed (supplement).    [provider]  Diclofenac Sodium (PENNSAID) 2 % SOLN Place 1 application onto the skin 2 (two) times daily. 10/14/16   Gerda Diss, DO  ibuprofen (ADVIL) 800 MG tablet Take 1 tablet (800 mg total) by mouth 3 (  three) times daily. 11/22/18   Arby BarrettePfeiffer, Lateefah Mallery, MD  ibuprofen (ADVIL,MOTRIN) 800 MG tablet Take 1 tablet (800 mg total) by mouth every 8 (eight) hours as needed. 06/29/16   Kinsinger, De BlanchLuke Aaron, MD  Multiple Vitamin (MULTIVITAMIN WITH MINERALS) TABS tablet Take 1 tablet by mouth daily.    [provider]  POTASSIUM GLUCONATE PO Take 1 tablet by mouth daily.    [provider]    Family History Family History  Problem Relation Age of Onset  . Diabetes type II Mother   . Stroke Mother   . Hypertension Mother   . Diabetes type II Father      Social History Social History   Tobacco Use  . Smoking status: Current Every Day Smoker    Packs/day: 1.00  . Smokeless tobacco: Never Used  Substance Use Topics  . Alcohol use: No  . Drug use: No    Comment: in remission from heroin      Allergies   Eggs or egg-derived products, Penicillins, and Dye fdc red [red dye]   Review of Systems Review of Systems 10 Systems reviewed and are negative for acute change except as noted in the HPI.  Physical Exam Updated Vital Signs BP (!) 137/101 (BP Location: Left Arm)   Pulse 73   Temp 98.7 F (37.1 C) (Oral)   Resp 18   Ht 6\' 2"  (1.88 m)   Wt 106.1 kg   SpO2 98%   BMI 30.04 kg/m   Physical Exam Constitutional:      Comments: Alert nontoxic clinically well in appearance.  Obvious facial swelling.  HENT:     Head:     Comments: Moderate to large diffuse swelling of the lower aspect of the right infra mandibular area.  This is not extending into the preauricular or parotid area.  Nares are patent but slightly boggy.  Steer airway is widely patent.  No tongue or intraoral swelling.  Dentition is nontender.  Beneath the tongue there is a visible submandibular stone in the salivary gland. Eyes:     Extraocular Movements: Extraocular movements intact.  Neck:     Musculoskeletal: Neck supple.     Comments: Very tender to palpation over the infra mandibular swelling.  Neck however is no meningismus. Pulmonary:     Effort: Pulmonary effort is normal.  Skin:    General: Skin is warm and dry.  Neurological:     General: No focal deficit present.     Mental Status: He is oriented to person, place, and time.     Cranial Nerves: No cranial nerve deficit.  Psychiatric:        Mood and Affect: Mood normal.      ED Treatments / Results  Labs (all labs ordered are listed, but only abnormal results are displayed) Labs Reviewed - No data to display  EKG None  Radiology No results found.  Procedures Procedures (including  critical care time) Right Wharton's duct salivary gland removal.  I was able to visualize a small stone right in the exit of the duct.  With gentle manual manipulation I was able to extrude approximately 3 mm salivary gland stone.  With continuous gentle pressure over the submandibular gland I was able to express a large amount of milky fluid. Medications Ordered in ED Medications  ibuprofen (ADVIL) tablet 800 mg (800 mg Oral Given 11/22/18 1531)     Initial Impression / Assessment and Plan / ED Course  I have reviewed the triage vital signs  and the nursing notes.  Pertinent labs & imaging results that were available during my care of the patient were reviewed by me and considered in my medical decision making (see chart for details).       Patient had a visible stone in his Wharton duct with submandibular swelling.  This occurred acutely.  Patient does not have signs of sepsis or diffuse deep space infection.  Stone was extruded manually.  I will have the patient changed to Augmentin as opposed to Keflex at this time for better oral microbe coverage.  Patient is counseled to follow-up with ENT and return precautions reviewed.  Final Clinical Impressions(s) / ED Diagnoses   Final diagnoses:  Sialoadenitis of submandibular gland    ED Discharge Orders         Ordered    amoxicillin-clavulanate (AUGMENTIN) 875-125 MG tablet  2 times daily     11/22/18 1524    ibuprofen (ADVIL) 800 MG tablet  3 times daily     11/22/18 1524           Arby BarrettePfeiffer, Kabrea Seeney, MD 11/22/18 1534

## 2018-11-22 NOTE — Discharge Instructions (Addendum)
1.  Schedule a recheck appointment with [facility].  Call today to schedule an appointment within the next 3 to 4 days. 2.  Start taking Augmentin as prescribed.  Do not take Keflex.  This antibiotic is not likely to be as effective in treating your condition.  Apply a warm compress to the area of swelling.  Try to gently massage the area to help expel the buildup fluid. 3.  Return to the emergency department if symptoms are worsening or changing.

## 2019-03-21 DIAGNOSIS — G4733 Obstructive sleep apnea (adult) (pediatric): Secondary | ICD-10-CM | POA: Diagnosis not present

## 2019-04-28 DIAGNOSIS — R5383 Other fatigue: Secondary | ICD-10-CM | POA: Diagnosis not present

## 2019-04-28 DIAGNOSIS — R432 Parageusia: Secondary | ICD-10-CM | POA: Diagnosis not present

## 2019-04-28 DIAGNOSIS — U071 COVID-19: Secondary | ICD-10-CM | POA: Diagnosis not present

## 2019-06-28 ENCOUNTER — Telehealth: Payer: Self-pay

## 2019-06-28 NOTE — Telephone Encounter (Signed)
Please advise 

## 2019-06-28 NOTE — Telephone Encounter (Signed)
Patient wife calling because she wanted to know would it be safe for her husband to take the coivd vaccine because of his health conditions. toc appt 08/09/19

## 2019-06-28 NOTE — Telephone Encounter (Signed)
Patient notified. Advised to call or send message with vaccination information to update chart

## 2019-06-28 NOTE — Telephone Encounter (Signed)
See no reason why he cannot get the vaccine.  Katina Degree. Jimmey Ralph, MD 06/28/2019 12:57 PM

## 2019-08-09 ENCOUNTER — Encounter: Payer: BC Managed Care – PPO | Admitting: Family Medicine

## 2019-10-08 ENCOUNTER — Ambulatory Visit: Payer: BC Managed Care – PPO | Admitting: Family Medicine

## 2019-10-18 ENCOUNTER — Other Ambulatory Visit: Payer: Self-pay

## 2019-10-18 ENCOUNTER — Ambulatory Visit (INDEPENDENT_AMBULATORY_CARE_PROVIDER_SITE_OTHER): Payer: BC Managed Care – PPO | Admitting: Family Medicine

## 2019-10-18 VITALS — BP 108/70 | HR 68 | Temp 97.7°F | Ht 74.0 in | Wt 230.0 lb

## 2019-10-18 DIAGNOSIS — R39198 Other difficulties with micturition: Secondary | ICD-10-CM | POA: Diagnosis not present

## 2019-10-18 DIAGNOSIS — R109 Unspecified abdominal pain: Secondary | ICD-10-CM

## 2019-10-18 DIAGNOSIS — R319 Hematuria, unspecified: Secondary | ICD-10-CM | POA: Diagnosis not present

## 2019-10-18 LAB — POCT URINALYSIS DIPSTICK
Bilirubin, UA: NEGATIVE
Glucose, UA: NEGATIVE
Ketones, UA: NEGATIVE
Leukocytes, UA: NEGATIVE
Nitrite, UA: NEGATIVE
Protein, UA: POSITIVE — AB
Spec Grav, UA: >=1.03 — AB (ref 1.010–1.025)
Urobilinogen, UA: 1 E.U./dL
pH, UA: 6 (ref 5.0–8.0)

## 2019-10-18 LAB — TIQ-MISC

## 2019-10-18 NOTE — Addendum Note (Signed)
Addended by: Laddie Aquas A on: 10/18/2019 04:21 PM   Modules accepted: Orders

## 2019-10-18 NOTE — Patient Instructions (Signed)
It was very nice to see you today!  You have a small amount of blood in your urine.  I think you likely had a small kidney stone that passed on its own.  Please come back in 7 to 14 days to have a repeat urine test done.  Please come back sooner if your pain returns.  Take care, Dr Jimmey Ralph  Please try these tips to maintain a healthy lifestyle:   Eat at least 3 REAL meals and 1-2 snacks per day.  Aim for no more than 5 hours between eating.  If you eat breakfast, please do so within one hour of getting up.    Each meal should contain half fruits/vegetables, one quarter protein, and one quarter carbs (no bigger than a computer mouse)   Cut down on sweet beverages. This includes juice, soda, and sweet tea.     Drink at least 1 glass of water with each meal and aim for at least 8 glasses per day   Exercise at least 150 minutes every week.

## 2019-10-18 NOTE — Progress Notes (Signed)
   Grant Woods is a 49 y.o. male who presents today for an office visit.  Assessment/Plan:  New/Acute Problems: Hematuria/flank pain No longer has any pain though still has 1+ blood on UA.  Likely had kidney stone that has passed.  No red flag signs or symptoms.  He will come back in 1 to 2 weeks to recheck UA.  If has persistent blood would consider CT scan at that point versus referral to urology.  Discussed reasons to return to care and seek emergent care. Can use OTc meds as needed.     Subjective:  HPI:  Patient had right flank pain 4 to 5 days ago.  Lasted for 1 to 2 days and then subsided.  Patient's wife wanted him to get evaluated.  Has not had any other symptoms.  No hematuria.  No dysuria.  No fevers or chills.  No nausea or vomiting.       Objective:  Physical Exam: Temp 97.7 F (36.5 C) (Temporal)   Ht 6\' 2"  (1.88 m)   Wt 230 lb (104.3 kg)   BMI 29.53 kg/m   Gen: No acute distress, resting comfortably CV: Regular rate and rhythm with no murmurs appreciated Pulm: Normal work of breathing, clear to auscultation bilaterally with no crackles, wheezes, or rhonchi Back: No CVA tenderness. Neuro: Grossly normal, moves all extremities Psych: Normal affect and thought content      Lastacia Solum M. , MD 10/18/2019 4:04 PM

## 2019-10-18 NOTE — Addendum Note (Signed)
Addended by: Tiauna Whisnant A on: 10/18/2019 04:21 PM   Modules accepted: Orders  

## 2019-10-19 LAB — URINE CULTURE
MICRO NUMBER:: 10686362
Result:: NO GROWTH
SPECIMEN QUALITY:: ADEQUATE

## 2019-10-22 NOTE — Progress Notes (Signed)
Please inform patient of the following:  Urine culture negative for infection. We should recheck his urine sample when he comes back  for his follow up visit next week.  Katina Degree. Jimmey Ralph, MD 10/22/2019 9:14 AM

## 2019-10-29 ENCOUNTER — Ambulatory Visit (INDEPENDENT_AMBULATORY_CARE_PROVIDER_SITE_OTHER): Payer: BC Managed Care – PPO | Admitting: Family Medicine

## 2019-10-29 ENCOUNTER — Encounter: Payer: Self-pay | Admitting: Family Medicine

## 2019-10-29 ENCOUNTER — Other Ambulatory Visit: Payer: Self-pay

## 2019-10-29 ENCOUNTER — Other Ambulatory Visit: Payer: BC Managed Care – PPO

## 2019-10-29 VITALS — BP 122/68 | HR 69 | Temp 98.2°F | Ht 74.0 in | Wt 232.0 lb

## 2019-10-29 DIAGNOSIS — Z131 Encounter for screening for diabetes mellitus: Secondary | ICD-10-CM

## 2019-10-29 DIAGNOSIS — F17200 Nicotine dependence, unspecified, uncomplicated: Secondary | ICD-10-CM | POA: Diagnosis not present

## 2019-10-29 DIAGNOSIS — Z0001 Encounter for general adult medical examination with abnormal findings: Secondary | ICD-10-CM

## 2019-10-29 DIAGNOSIS — Z13220 Encounter for screening for lipoid disorders: Secondary | ICD-10-CM

## 2019-10-29 DIAGNOSIS — R319 Hematuria, unspecified: Secondary | ICD-10-CM | POA: Diagnosis not present

## 2019-10-29 DIAGNOSIS — Z125 Encounter for screening for malignant neoplasm of prostate: Secondary | ICD-10-CM | POA: Diagnosis not present

## 2019-10-29 DIAGNOSIS — N529 Male erectile dysfunction, unspecified: Secondary | ICD-10-CM

## 2019-10-29 DIAGNOSIS — Z6829 Body mass index (BMI) 29.0-29.9, adult: Secondary | ICD-10-CM

## 2019-10-29 DIAGNOSIS — G4730 Sleep apnea, unspecified: Secondary | ICD-10-CM

## 2019-10-29 DIAGNOSIS — E663 Overweight: Secondary | ICD-10-CM

## 2019-10-29 MED ORDER — TADALAFIL 10 MG PO TABS: 5.0000 mg | ORAL_TABLET | Freq: Every day | ORAL | 5 refills | Status: DC | PRN

## 2019-10-29 NOTE — Assessment & Plan Note (Signed)
-   Encourage CPAP compliance  

## 2019-10-29 NOTE — Progress Notes (Signed)
Chief Complaint:  Grant Woods is a 49 y.o. male who presents today for his annual comprehensive physical exam.    Assessment/Plan:  New/Acute Problems: Hematuria Recheck UA today.  Likely had kidney stone that passed.  If has persistent hematuria will need referral to urology.  Chronic Problems Addressed Today: Severe sleep apnea Encourage CPAP compliance.  Current every day smoker Patient was asked about his tobacco use today and was strongly advised to quit. Patient is currently not contemplative. We reviewed treatment options to assist him quit smoking including NRT, Chantix, and Bupropion. Follow up at next office visit.   Total time spent counseling approximately 3 minutes.    Erectile dysfunction Discussed treatment options.  Will start Cialis.   Preventative Healthcare: Check CBC, CMET, TSH, lipid panel, and PSA.  Patient Counseling(The following topics were reviewed and/or handout was given):  -Nutrition: Stressed importance of moderation in sodium/caffeine intake, saturated fat and cholesterol, caloric balance, sufficient intake of fresh fruits, vegetables, and fiber.  -Stressed the importance of regular exercise.   -Substance Abuse: Discussed cessation/primary prevention of tobacco, alcohol, or other drug use; driving or other dangerous activities under the influence; availability of treatment for abuse.   -Injury prevention: Discussed safety belts, safety helmets, smoke detector, smoking near bedding or upholstery.   -Sexuality: Discussed sexually transmitted diseases, partner selection, use of condoms, avoidance of unintended pregnancy and contraceptive alternatives.   -Dental health: Discussed importance of regular tooth brushing, flossing, and dental visits.  -Health maintenance and immunizations reviewed. Please refer to Health maintenance section.  Return to care in 1 year for next preventative visit.     Subjective:  HPI:  He has no acute complaints  today.   He was seen here couple of weeks ago.  Severe flank pain at that time.  UA was positive for blood.  Symptoms have resolved.  Lifestyle Diet: No specific diets.  Exercise: Limited due to covid.   Depression screen PHQ 2/9 06/09/2016  Decreased Interest 0  Down, Depressed, Hopeless 0  PHQ - 2 Score 0   Health Maintenance Due  Topic Date Due  . Hepatitis C Screening  Never done    ROS: Per HPI, otherwise a complete review of systems was negative.   PMH:  The following were reviewed and entered/updated in epic: Past Medical History:  Diagnosis Date  . Allergy   . Back pain   . Depression   . GERD (gastroesophageal reflux disease)   . Heroin use disorder, moderate, in sustained remission (HCC)   . MRSA (methicillin resistant Staphylococcus aureus)   . Opioid use disorder, moderate, on maintenance therapy [facility])    Patient Active Problem List   Diagnosis Date Noted  . Erectile dysfunction 10/29/2019  . Elevated bilirubin   . Pancreatitis 06/26/2016  . Acid reflux 06/26/2016  . Primary osteoarthritis of right knee 06/09/2016  . Achilles tendinitis of both lower extremities 06/09/2016  . Class 1 obesity due to excess calories without serious comorbidity with body mass index (BMI) of 30.0 to 30.9 in adult 06/09/2016  . Current every day smoker 06/09/2016  . Severe sleep apnea 06/09/2016  . Heroin use disorder, moderate, in sustained remission (HCC)   . Opioid use disorder, moderate, on maintenance therapy [facility])    Past Surgical History:  Procedure Laterality Date  . CHOLECYSTECTOMY N/A 06/28/2016   Procedure: LAPAROSCOPIC CHOLECYSTECTOMY WITH INTRAOPERATIVE CHOLANGIOGRAM;  Surgeon: Rodman Pickle, MD;  Location: [facility] OR;  Service: General;  Laterality: N/A;  . KNEE ARTHROSCOPY  Right     Family History  Problem Relation Age of Onset  . Diabetes type II Mother   . Stroke Mother   . Hypertension Mother   . Diabetes type II Father     Medications- reviewed and  updated Current Outpatient Medications  Medication Sig Dispense Refill  . Buprenorphine HCl-Naloxone HCl (ZUBSOLV) 5.7-1.4 MG SUBL Place 1 tablet under the tongue 2 (two) times daily. 6 AM and 4 PM    . ibuprofen (ADVIL) 800 MG tablet Take 1 tablet (800 mg total) by mouth 3 (three) times daily. 21 tablet 0  . Multiple Vitamin (MULTIVITAMIN WITH MINERALS) TABS tablet Take 1 tablet by mouth daily.    Marland Kitchen POTASSIUM GLUCONATE PO Take 1 tablet by mouth daily.    . tadalafil (CIALIS) 10 MG tablet Take 0.5-1 tablets (5-10 mg total) by mouth daily as needed for erectile dysfunction. 30 tablet 5   No current facility-administered medications for this visit.    Allergies-reviewed and updated Allergies  Allergen Reactions  . Eggs Or Egg-Derived Products Anaphylaxis and Swelling  . Penicillins Hives    Has patient had a PCN reaction causing immediate rash, facial/tongue/throat swelling, SOB or lightheadedness with hypotension: unknown Has patient had a PCN reaction causing severe rash involving mucus membranes or skin necrosis: unknown Has patient had a PCN reaction that required hospitalization unknown Has patient had a PCN reaction occurring within the last 10 years: no If all of the above answers are "NO", then may proceed with Cephalosporin use.  Childhood allergy  . Dye Fdc Red [Red Dye]     Social History   Socioeconomic History  . Marital status: Single    Spouse name: Not on file  . Number of children: Not on file  . Years of education: Not on file  . Highest education level: Not on file  Occupational History  . Occupation: Warden/ranger: Nurse, children's  Tobacco Use  . Smoking status: Current Every Day Smoker    Packs/day: 1.00  . Smokeless tobacco: Never Used  Vaping Use  . Vaping Use: Never used  Substance and Sexual Activity  . Alcohol use: No  . Drug use: No    Comment: in remission from heroin   . Sexual activity: Not on file  Other Topics Concern  . Not  on file  Social History Narrative  . Not on file   Social Determinants of Health   Financial Resource Strain:   . Difficulty of Paying Living Expenses:   Food Insecurity:   . Worried About Programme researcher, broadcasting/film/video in the Last Year:   . Barista in the Last Year:   Transportation Needs:   . Freight forwarder (Medical):   Marland Kitchen Lack of Transportation (Non-Medical):   Physical Activity:   . Days of Exercise per Week:   . Minutes of Exercise per Session:   Stress:   . Feeling of Stress :   Social Connections:   . Frequency of Communication with Friends and Family:   . Frequency of Social Gatherings with Friends and Family:   . Attends Religious Services:   . Active Member of Clubs or Organizations:   . Attends Banker Meetings:   Marland Kitchen Marital Status:         Objective:  Physical Exam: BP 122/68   Pulse 69   Temp 98.2 F (36.8 C)   Ht 6\' 2"  (1.88 m)   Wt 232 lb (105.2 kg)  SpO2 95%   BMI 29.79 kg/m   Body mass index is 29.79 kg/m. Wt Readings from Last 3 Encounters:  10/29/19 232 lb (105.2 kg)  10/18/19 230 lb (104.3 kg)  11/22/18 234 lb (106.1 kg)  Gen: NAD, resting comfortably HEENT: TMs normal bilaterally. OP clear. No thyromegaly noted.  CV: RRR with no murmurs appreciated Pulm: NWOB, CTAB with no crackles, wheezes, or rhonchi GI: Normal bowel sounds present. Soft, Nontender, Nondistended. MSK: no edema, cyanosis, or clubbing noted Skin: warm, dry Neuro: CN2-12 grossly intact. Strength 5/5 in upper and lower extremities. Reflexes symmetric and intact bilaterally.  Psych: Normal affect and thought content     Linsi Humann M. Jimmey Ralph, MD 10/29/2019 3:51 PM

## 2019-10-29 NOTE — Assessment & Plan Note (Signed)
Discussed treatment options.  Will start Cialis.

## 2019-10-29 NOTE — Patient Instructions (Signed)
It was very nice to see you today!  We will check blood work today.  Also check a urine sample.  Please continue working on diet and exercise.  Please make sure that you are using your CPAP machine as much as you can.  I will see back in year for your next checkup.  Please come back to see me sooner if needed.  Take care, Dr Jerline Pain  Please try these tips to maintain a healthy lifestyle:   Eat at least 3 REAL meals and 1-2 snacks per day.  Aim for no more than 5 hours between eating.  If you eat breakfast, please do so within one hour of getting up.    Each meal should contain half fruits/vegetables, one quarter protein, and one quarter carbs (no bigger than a computer mouse)   Cut down on sweet beverages. This includes juice, soda, and sweet tea.     Drink at least 1 glass of water with each meal and aim for at least 8 glasses per day   Exercise at least 150 minutes every week.    Preventive Care 13-49 Years Old, Male Preventive care refers to lifestyle choices and visits with your health care provider that can promote health and wellness. This includes:  A yearly physical exam. This is also called an annual well check.  Regular dental and eye exams.  Immunizations.  Screening for certain conditions.  Healthy lifestyle choices, such as eating a healthy diet, getting regular exercise, not using drugs or products that contain nicotine and tobacco, and limiting alcohol use. What can I expect for my preventive care visit? Physical exam Your health care provider will check:  Height and weight. These may be used to calculate body mass index (BMI), which is a measurement that tells if you are at a healthy weight.  Heart rate and blood pressure.  Your skin for abnormal spots. Counseling Your health care provider may ask you questions about:  Alcohol, tobacco, and drug use.  Emotional well-being.  Home and relationship well-being.  Sexual activity.  Eating  habits.  Work and work Statistician. What immunizations do I need?  Influenza (flu) vaccine  This is recommended every year. Tetanus, diphtheria, and pertussis (Tdap) vaccine  You may need a Td booster every 10 years. Varicella (chickenpox) vaccine  You may need this vaccine if you have not already been vaccinated. Zoster (shingles) vaccine  You may need this after age 76. Measles, mumps, and rubella (MMR) vaccine  You may need at least one dose of MMR if you were born in 1957 or later. You may also need a second dose. Pneumococcal conjugate (PCV13) vaccine  You may need this if you have certain conditions and were not previously vaccinated. Pneumococcal polysaccharide (PPSV23) vaccine  You may need one or two doses if you smoke cigarettes or if you have certain conditions. Meningococcal conjugate (MenACWY) vaccine  You may need this if you have certain conditions. Hepatitis A vaccine  You may need this if you have certain conditions or if you travel or work in places where you may be exposed to hepatitis A. Hepatitis B vaccine  You may need this if you have certain conditions or if you travel or work in places where you may be exposed to hepatitis B. Haemophilus influenzae type b (Hib) vaccine  You may need this if you have certain risk factors. Human papillomavirus (HPV) vaccine  If recommended by your health care provider, you may need three doses over 6 months.  You may receive vaccines as individual doses or as more than one vaccine together in one shot (combination vaccines). Talk with your health care provider about the risks and benefits of combination vaccines. What tests do I need? Blood tests  Lipid and cholesterol levels. These may be checked every 5 years, or more frequently if you are over 71 years old.  Hepatitis C test.  Hepatitis B test. Screening  Lung cancer screening. You may have this screening every year starting at age 32 if you have a  30-pack-year history of smoking and currently smoke or have quit within the past 15 years.  Prostate cancer screening. Recommendations will vary depending on your family history and other risks.  Colorectal cancer screening. All adults should have this screening starting at age 2 and continuing until age 89. Your health care provider may recommend screening at age 78 if you are at increased risk. You will have tests every 1-10 years, depending on your results and the type of screening test.  Diabetes screening. This is done by checking your blood sugar (glucose) after you have not eaten for a while (fasting). You may have this done every 1-3 years.  Sexually transmitted disease (STD) testing. Follow these instructions at home: Eating and drinking  Eat a diet that includes fresh fruits and vegetables, whole grains, lean protein, and low-fat dairy products.  Take vitamin and mineral supplements as recommended by your health care provider.  Do not drink alcohol if your health care provider tells you not to drink.  If you drink alcohol: ? Limit how much you have to 0-2 drinks a day. ? Be aware of how much alcohol is in your drink. In the [place] one drink equals one 12 oz bottle of beer (355 mL), one 5 oz glass of wine (148 mL), or one 1 oz glass of hard liquor (44 mL). Lifestyle  Take daily care of your teeth and gums.  Stay active. Exercise for at least 30 minutes on 5 or more days each week.  Do not use any products that contain nicotine or tobacco, such as cigarettes, e-cigarettes, and chewing tobacco. If you need help quitting, ask your health care provider.  If you are sexually active, practice safe sex. Use a condom or other form of protection to prevent STIs (sexually transmitted infections).  Talk with your health care provider about taking a low-dose aspirin every day starting at age 103. What's next?  Go to your health care provider once a year for a well check visit.  Ask  your health care provider how often you should have your eyes and teeth checked.  Stay up to date on all vaccines. This information is not intended to replace advice given to you by your health care provider. Make sure you discuss any questions you have with your health care provider. Document Revised: 03/22/2018 Document Reviewed: 03/22/2018 Elsevier Patient Education  2020 [employer].

## 2019-10-29 NOTE — Assessment & Plan Note (Signed)
Patient was asked about his tobacco use today and was strongly advised to quit. Patient is currently not contemplative. We reviewed treatment options to assist him quit smoking including NRT, Chantix, and Bupropion. Follow up at next office visit.   Total time spent counseling approximately 3 minutes.   

## 2019-10-30 LAB — URINALYSIS, ROUTINE W REFLEX MICROSCOPIC
Bilirubin Urine: NEGATIVE
Ketones, ur: NEGATIVE
Leukocytes,Ua: NEGATIVE
Nitrite: NEGATIVE
RBC / HPF: NONE SEEN (ref 0–?)
Specific Gravity, Urine: 1.025 (ref 1.000–1.030)
Total Protein, Urine: NEGATIVE
Urine Glucose: NEGATIVE
Urobilinogen, UA: 0.2 (ref 0.0–1.0)
WBC, UA: NONE SEEN (ref 0–?)
pH: 6 (ref 5.0–8.0)

## 2019-10-30 LAB — CBC
HCT: 36.1 % — ABNORMAL LOW (ref 39.0–52.0)
Hemoglobin: 12.4 g/dL — ABNORMAL LOW (ref 13.0–17.0)
MCHC: 34.4 g/dL (ref 30.0–36.0)
MCV: 88.5 fl (ref 78.0–100.0)
Platelets: 283 10*3/uL (ref 150.0–400.0)
RBC: 4.08 Mil/uL — ABNORMAL LOW (ref 4.22–5.81)
RDW: 14.2 % (ref 11.5–15.5)
WBC: 9.7 10*3/uL (ref 4.0–10.5)

## 2019-10-30 LAB — COMPREHENSIVE METABOLIC PANEL
ALT: 14 U/L (ref 0–53)
AST: 15 U/L (ref 0–37)
Albumin: 4.4 g/dL (ref 3.5–5.2)
Alkaline Phosphatase: 51 U/L (ref 39–117)
BUN: 22 mg/dL (ref 6–23)
CO2: 27 mEq/L (ref 19–32)
Calcium: 9.4 mg/dL (ref 8.4–10.5)
Chloride: 105 mEq/L (ref 96–112)
Creatinine, Ser: 0.86 mg/dL (ref 0.40–1.50)
GFR: 114.1 mL/min (ref >60.00)
Glucose, Bld: 85 mg/dL (ref 70–99)
Potassium: 3.8 mEq/L (ref 3.5–5.1)
Sodium: 139 mEq/L (ref 135–145)
Total Bilirubin: 0.4 mg/dL (ref 0.2–1.2)
Total Protein: 6.8 g/dL (ref 6.0–8.3)

## 2019-10-30 LAB — LIPID PANEL
Cholesterol: 163 mg/dL (ref 0–200)
HDL: 41.2 mg/dL (ref >39.00)
LDL Cholesterol: 90 mg/dL (ref 0–99)
NonHDL: 122.02
Total CHOL/HDL Ratio: 4
Triglycerides: 158 mg/dL — ABNORMAL HIGH (ref 0.0–149.0)
VLDL: 31.6 mg/dL (ref 0.0–40.0)

## 2019-10-30 LAB — PSA: PSA: 0.41 ng/mL (ref 0.10–4.00)

## 2019-10-30 LAB — HEMOGLOBIN A1C: Hgb A1c MFr Bld: 6.4 % (ref 4.6–6.5)

## 2019-10-30 LAB — TSH: TSH: 1.2 u[IU]/mL (ref 0.35–4.50)

## 2019-11-01 ENCOUNTER — Other Ambulatory Visit: Payer: BC Managed Care – PPO

## 2019-12-09 ENCOUNTER — Telehealth: Payer: Self-pay | Admitting: Family Medicine

## 2019-12-09 NOTE — Telephone Encounter (Signed)
Patient called in and stated that his CPAP machine was recalled and that he needed a new order for one.

## 2019-12-10 NOTE — Telephone Encounter (Signed)
Grant Woods's wife is calling in this afternoon, stating the old CPAP has expired so he has not used it in almost a week, asking for an update.

## 2019-12-10 NOTE — Telephone Encounter (Signed)
Spoke with patient, patient stated he seen on tv and on his app CPAP machine was recalled  Advise to call number on CPAP machine for more question and information about recalled  Pt agreed

## 2020-01-01 DIAGNOSIS — G4733 Obstructive sleep apnea (adult) (pediatric): Secondary | ICD-10-CM | POA: Diagnosis not present

## 2020-01-29 ENCOUNTER — Other Ambulatory Visit: Payer: BC Managed Care – PPO

## 2020-01-29 DIAGNOSIS — Z20822 Contact with and (suspected) exposure to covid-19: Secondary | ICD-10-CM

## 2020-01-30 ENCOUNTER — Telehealth: Payer: Self-pay

## 2020-01-30 LAB — NOVEL CORONAVIRUS, NAA: SARS-CoV-2, NAA: NOT DETECTED

## 2020-01-30 LAB — SARS-COV-2, NAA 2 DAY TAT

## 2020-01-30 NOTE — Telephone Encounter (Signed)
Called and lm for pt wife tcb.  

## 2020-01-30 NOTE — Telephone Encounter (Signed)
See below

## 2020-01-30 NOTE — Telephone Encounter (Signed)
Recommend that he call pulmonology office - they are they ones that set this up initially.  Katina Degree. Jimmey Ralph, MD 01/30/2020 11:12 AM

## 2020-01-30 NOTE — Telephone Encounter (Signed)
Patient's wife called in stating that they called the number on the CPAP machine and was advised it was going to take at most 2 years for them to send out another one and recommended that [facility] call Dr.Parker and see about possibly getting an APAP machine. Wife does state that Grant Woods's sleeping conditions have gotten a little bit worse, still having bad fatigue and has is starting to have memory loss due to the excessive sleepiness. Wife wants to know what else they can in the meantime as insurance will not cover another one.   Wife asked if someone could give her a call back, as Maninder will be at work away from his phone.

## 2020-03-03 DIAGNOSIS — Z79891 Long term (current) use of opiate analgesic: Secondary | ICD-10-CM | POA: Diagnosis not present

## 2020-03-03 DIAGNOSIS — F1210 Cannabis abuse, uncomplicated: Secondary | ICD-10-CM | POA: Diagnosis not present

## 2020-03-03 DIAGNOSIS — Z7151 Drug abuse counseling and surveillance of drug abuser: Secondary | ICD-10-CM | POA: Diagnosis not present

## 2020-03-03 DIAGNOSIS — F1411 Cocaine abuse, in remission: Secondary | ICD-10-CM | POA: Diagnosis not present

## 2020-03-03 DIAGNOSIS — F1121 Opioid dependence, in remission: Secondary | ICD-10-CM | POA: Diagnosis not present

## 2020-03-31 DIAGNOSIS — F1411 Cocaine abuse, in remission: Secondary | ICD-10-CM | POA: Diagnosis not present

## 2020-03-31 DIAGNOSIS — F1121 Opioid dependence, in remission: Secondary | ICD-10-CM | POA: Diagnosis not present

## 2020-03-31 DIAGNOSIS — F1210 Cannabis abuse, uncomplicated: Secondary | ICD-10-CM | POA: Diagnosis not present

## 2020-03-31 DIAGNOSIS — Z7151 Drug abuse counseling and surveillance of drug abuser: Secondary | ICD-10-CM | POA: Diagnosis not present

## 2020-04-28 DIAGNOSIS — Z7151 Drug abuse counseling and surveillance of drug abuser: Secondary | ICD-10-CM | POA: Diagnosis not present

## 2020-04-28 DIAGNOSIS — F1210 Cannabis abuse, uncomplicated: Secondary | ICD-10-CM | POA: Diagnosis not present

## 2020-04-28 DIAGNOSIS — F1121 Opioid dependence, in remission: Secondary | ICD-10-CM | POA: Diagnosis not present

## 2020-04-28 DIAGNOSIS — F1411 Cocaine abuse, in remission: Secondary | ICD-10-CM | POA: Diagnosis not present

## 2020-04-30 DIAGNOSIS — F1121 Opioid dependence, in remission: Secondary | ICD-10-CM | POA: Diagnosis not present

## 2020-04-30 DIAGNOSIS — Z79891 Long term (current) use of opiate analgesic: Secondary | ICD-10-CM | POA: Diagnosis not present

## 2020-06-11 ENCOUNTER — Ambulatory Visit: Payer: BC Managed Care – PPO | Admitting: Family Medicine

## 2020-06-22 ENCOUNTER — Other Ambulatory Visit: Payer: Self-pay

## 2020-06-22 ENCOUNTER — Ambulatory Visit (INDEPENDENT_AMBULATORY_CARE_PROVIDER_SITE_OTHER): Payer: BC Managed Care – PPO | Admitting: Family Medicine

## 2020-06-22 VITALS — BP 120/78 | HR 86 | Temp 97.3°F | Ht 74.0 in | Wt 234.0 lb

## 2020-06-22 DIAGNOSIS — R319 Hematuria, unspecified: Secondary | ICD-10-CM

## 2020-06-22 DIAGNOSIS — G4730 Sleep apnea, unspecified: Secondary | ICD-10-CM

## 2020-06-22 DIAGNOSIS — N529 Male erectile dysfunction, unspecified: Secondary | ICD-10-CM | POA: Diagnosis not present

## 2020-06-22 DIAGNOSIS — Z1211 Encounter for screening for malignant neoplasm of colon: Secondary | ICD-10-CM | POA: Diagnosis not present

## 2020-06-22 DIAGNOSIS — F17200 Nicotine dependence, unspecified, uncomplicated: Secondary | ICD-10-CM

## 2020-06-22 MED ORDER — TADALAFIL 10 MG PO TABS: 5.0000 mg | ORAL_TABLET | Freq: Every day | ORAL | 5 refills | Status: DC | PRN

## 2020-06-22 NOTE — Assessment & Plan Note (Signed)
Cialis refilled.  Did not pick up previous prescription.

## 2020-06-22 NOTE — Assessment & Plan Note (Signed)
Seems like he has a malfunctioning CPAP machine.  He has seen pulmonology in the past but has not seen them for several years.  His machine was recalled that his home health agency has refused to replace it.  Will place referral to pulmonology for further evaluation.

## 2020-06-22 NOTE — Assessment & Plan Note (Signed)
Briefly discussed cessation.  He is due for lung cancer screening.  Will defer this to pulmonology.

## 2020-06-22 NOTE — Progress Notes (Signed)
   Grant Woods is a 50 y.o. male who presents today for an office visit.  Assessment/Plan:  New/Acute Problems: Hematuria No red flag signs or symptoms however given persistence of flank pain stressed importance of urology follow-up.  Unfortunately this was not done 9 months ago after his annual physical.  Will place referral today.  Also gave contact information to his wife for the urologists office.   Forgetfulness Likely due to excessive daytime somnolence due to poorly treated sleep apnea.  Hopefully will have some improvement with treatment of his sleep apnea as below.  Discussed reasons to return to care.  Chronic Problems Addressed Today: Erectile dysfunction Cialis refilled.  Did not pick up previous prescription.  Severe sleep apnea Seems like he has a malfunctioning CPAP machine.  He has seen pulmonology in the past but has not seen them for several years.  His machine was recalled that his home health agency has refused to replace it.  Will place referral to pulmonology for further evaluation.  Current every day smoker Briefly discussed cessation.  He is due for lung cancer screening.  Will defer this to pulmonology.  Preventative health care We will place referral for colon cancer screening.    Subjective:  HPI:  Patient here with his wife today.  He has multiple issues that he would like to discuss outlined below:  He has had persistent right flank pain for the last several months.  He was seen here about 9 months ago and was noted to have some hematuria.  There was concern for possible kidney stone. We discussed referral to urology at that time.  Patient does admit that he has been trying to see if he would get better on its on and that he did not follow up as we discussed. Pain has been persistent.  He has not noticed any gross hematuria.    Is also concerned about his CPAP machine.  States that he had a recall recently however his home health agency has refused to  replace it.  Does not feel like the mask fits properly.  After waking up in the morning has severe bloating and this abdominal cavity.  Additionally does not feel refreshed upon waking up.  Has history of severe sleep apnea.  He also has been having more forgetfulness lately the possibly could be due to his untreated sleep apnea.  Is also been having more bilateral leg swelling.  He does admit to eating a lot of salty foods and is on his feet for most of the day.        Objective:  Physical Exam: BP 120/78   Pulse 86   Temp (!) 97.3 F (36.3 C) (Temporal)   Ht 6\' 2"  (1.88 m)   Wt 234 lb (106.1 kg)   SpO2 96%   BMI 30.04 kg/m   Gen: No acute distress, resting comfortably MSK: Varicose veins noted bilaterally.  Trace pretibial edema bilaterally.  Sock lines present. Neuro: Grossly normal, moves all extremities Psych: Normal affect and thought content   Time Spent: 50 minutes of total time was spent on the date of the encounter performing the following actions: chart review prior to seeing the patient including recent labs, obtaining history, performing a medically necessary exam, counseling on the treatment plan, placing orders, and documenting in our EHR.       . Katina Degree, MD 06/22/2020 4:11 PM

## 2020-06-22 NOTE — Patient Instructions (Signed)
It was very nice to see you today!  I will place a referral for you to see [facility], with our pulmonology, and with our gastroenterology.  I will refill your Cialis today.  Please let me know if you have not heard anything from referrals by next week.   [facility] Phone: [phone]  [facility] Phone: [phone]  [facility] Phone: [phone]  Take care, Dr Jimmey Ralph  Please try these tips to maintain a healthy lifestyle:   Eat at least 3 REAL meals and 1-2 snacks per day.  Aim for no more than 5 hours between eating.  If you eat breakfast, please do so within one hour of getting up.    Each meal should contain half fruits/vegetables, one quarter protein, and one quarter carbs (no bigger than a computer mouse)   Cut down on sweet beverages. This includes juice, soda, and sweet tea.     Drink at least 1 glass of water with each meal and aim for at least 8 glasses per day   Exercise at least 150 minutes every week.

## 2020-06-24 DIAGNOSIS — F1120 Opioid dependence, uncomplicated: Secondary | ICD-10-CM | POA: Diagnosis not present

## 2020-06-24 DIAGNOSIS — Z7151 Drug abuse counseling and surveillance of drug abuser: Secondary | ICD-10-CM | POA: Diagnosis not present

## 2020-06-24 DIAGNOSIS — F1121 Opioid dependence, in remission: Secondary | ICD-10-CM | POA: Diagnosis not present

## 2020-06-24 DIAGNOSIS — F1411 Cocaine abuse, in remission: Secondary | ICD-10-CM | POA: Diagnosis not present

## 2020-06-24 DIAGNOSIS — F1210 Cannabis abuse, uncomplicated: Secondary | ICD-10-CM | POA: Diagnosis not present

## 2020-06-30 ENCOUNTER — Telehealth: Payer: Self-pay

## 2020-06-30 ENCOUNTER — Other Ambulatory Visit: Payer: Self-pay

## 2020-06-30 ENCOUNTER — Encounter: Payer: Self-pay | Admitting: Physician Assistant

## 2020-06-30 DIAGNOSIS — Z122 Encounter for screening for malignant neoplasm of respiratory organs: Secondary | ICD-10-CM

## 2020-06-30 NOTE — Telephone Encounter (Signed)
Spoke with patient wife order placed.

## 2020-06-30 NOTE — Telephone Encounter (Signed)
Patient states with the referral to pulmonology  we also need to order a lung cancer screening and smoking sensation test. Patient states when speaking to dr. Jimmey Ralph this is originally what was suppose to be sent to them, but all that was sent was an order regarding CPAP related issues.

## 2020-07-14 ENCOUNTER — Encounter: Payer: Self-pay | Admitting: Pulmonary Disease

## 2020-07-14 ENCOUNTER — Ambulatory Visit (INDEPENDENT_AMBULATORY_CARE_PROVIDER_SITE_OTHER): Payer: BC Managed Care – PPO | Admitting: Pulmonary Disease

## 2020-07-14 ENCOUNTER — Other Ambulatory Visit: Payer: Self-pay

## 2020-07-14 DIAGNOSIS — F17200 Nicotine dependence, unspecified, uncomplicated: Secondary | ICD-10-CM

## 2020-07-14 DIAGNOSIS — G4730 Sleep apnea, unspecified: Secondary | ICD-10-CM | POA: Diagnosis not present

## 2020-07-14 MED ORDER — NICOTINE 10 MG IN INHA: 1.0000 | RESPIRATORY_TRACT | 0 refills | Status: DC | PRN

## 2020-07-14 NOTE — Assessment & Plan Note (Addendum)
Get back on your CPAP machine CPAP supplies will be renewed -including new mask and filters-DME is [employer] Prescription to add EPR/C-Flex setting of 3 and changed to auto CPAP 8 to 18 cm Schedule CPAP titration study -if he qualifies can switch him to BiPAP 16/11 cm or equal and auto BiPAP settings if he is still does not tolerate CPAP  We have to weigh the risk of untreated OSA if he stops using the machine versus the risk of using the machine. Since his sleep apnea is severe, would recommend at this time for him to continue using the machine. He should definitely register the machine for replacement at the [employer]. He should check with DME when they can provide him with the filter to decrease the risk from the machine.

## 2020-07-14 NOTE — Progress Notes (Signed)
Subjective:    Patient ID: Grant Woods, male    DOB: May 23, 1970, 50 y.o.   MRN: [id number]  HPI  15 y o smoker presents to re establish care for severe obstructive sleep apnea PMH - opiate abuse on suboxone   Severe OSA was diagnosed in 2018 and he was placed on a [employer] machine with auto CPAP settings up to 18 cm.  He complained of high pressure .  He was last seen 04/2017 and has been lost to follow-up since then.  DME-[employer] He presents for follow-up now.  He is now working the third shift as a Estate agent. independent history was obtained from the wife on the phone -she has concerns about 3 problems Vear Clock recall -Severe OSA symptoms , he complains that due to increased pressure he feels that there is increased air in his chest cavity -Resumed smoking and increased to 1 pack/day, would qualify for lung cancer screening?  Epworth sleepiness score is 16 and reports sleepiness while sitting and reading, watching TV or lying down to rest in the afternoons Bedtime is between 730 to 9 AM, falls asleep right away, sleeps on his back with 1-2 pillows, reports 1-2 awakenings and is out of bed by 7 PM feeling tired.  Wife has witnessed apneas when he does not use the machine and loud snoring There is no history suggestive of cataplexy, sleep paralysis or parasomnias  Wife also reports heavy breathing in the daytime and wonders if he has COPD  Significant tests/ events reviewed  NPSG  11/2016 - severe OSA with AHI 98/hour and lowest desaturation of 84%.  CPAP did not control events and he was started on BiPAP and corrected with 16/11 with a large full facemask  Past Medical History:  Diagnosis Date  . Allergy   . Back pain   . Depression   . GERD (gastroesophageal reflux disease)   . Heroin use disorder, moderate, in sustained remission (HCC)   . MRSA (methicillin resistant Staphylococcus aureus)   . Opioid use disorder, moderate, on maintenance therapy [facility])      Past Surgical History:  Procedure Laterality Date  . CHOLECYSTECTOMY N/A 06/28/2016   Procedure: LAPAROSCOPIC CHOLECYSTECTOMY WITH INTRAOPERATIVE CHOLANGIOGRAM;  Surgeon: Rodman Pickle, MD;  Location: [facility] OR;  Service: General;  Laterality: N/A;  . KNEE ARTHROSCOPY Right     Allergies  Allergen Reactions  . Eggs Or Egg-Derived Products Anaphylaxis and Swelling  . Penicillins Hives    Has patient had a PCN reaction causing immediate rash, facial/tongue/throat swelling, SOB or lightheadedness with hypotension: unknown Has patient had a PCN reaction causing severe rash involving mucus membranes or skin necrosis: unknown Has patient had a PCN reaction that required hospitalization unknown Has patient had a PCN reaction occurring within the last 10 years: no If all of the above answers are "NO", then may proceed with Cephalosporin use.  Childhood allergy  . Dye Fdc Red [Red Dye]     Social History   Socioeconomic History  . Marital status: Single    Spouse name: Not on file  . Number of children: Not on file  . Years of education: Not on file  . Highest education level: Not on file  Occupational History  . Occupation: Warden/ranger: Nurse, children's  Tobacco Use  . Smoking status: Current Every Day Smoker    Packs/day: 1.00  . Smokeless tobacco: Never Used  Vaping Use  . Vaping Use: Never used  Substance and Sexual  Activity  . Alcohol use: No  . Drug use: No    Comment: in remission from heroin   . Sexual activity: Not on file  Other Topics Concern  . Not on file  Social History Narrative  . Not on file   Social Determinants of Health   Financial Resource Strain: Not on file  Food Insecurity: Not on file  Transportation Needs: Not on file  Physical Activity: Not on file  Stress: Not on file  Social Connections: Not on file  Intimate Partner Violence: Not on file    Family History  Problem Relation Age of Onset  . Diabetes type II Mother    . Stroke Mother   . Hypertension Mother   . Diabetes type II Father      Review of Systems Constitutional: negative for anorexia, fevers and sweats  Eyes: negative for irritation, redness and visual disturbance  Ears, nose, mouth, throat, and face: negative for earaches, epistaxis, nasal congestion and sore throat  Respiratory: negative for cough,  sputum and wheezing  Cardiovascular: negative for chest pain, dyspnea, lower extremity edema, orthopnea, palpitations and syncope  Gastrointestinal: negative for abdominal pain, constipation, diarrhea, melena, nausea and vomiting  Genitourinary:negative for dysuria, frequency and hematuria  Hematologic/lymphatic: negative for bleeding, easy bruising and lymphadenopathy  Musculoskeletal:negative for arthralgias, muscle weakness and stiff joints  Neurological: negative for coordination problems, gait problems, headaches and weakness  Endocrine: negative for diabetic symptoms including polydipsia, polyuria and weight loss     Objective:   Physical Exam  Gen. Pleasant, obese, in no distress, normal affect ENT - no pallor,icterus, no post nasal drip, class 2-3 airway Neck: No JVD, no thyromegaly, no carotid bruits Lungs: no use of accessory muscles, no dullness to percussion, decreased without rales or rhonchi  Cardiovascular: Rhythm regular, heart sounds  normal, no murmurs or gallops, no peripheral edema Abdomen: soft and non-tender, no hepatosplenomegaly, BS normal. Musculoskeletal: No deformities, no cyanosis or clubbing Neuro:  alert, non focal, no tremors       Assessment & Plan:

## 2020-07-14 NOTE — Assessment & Plan Note (Signed)
Referral to lung cancer screening program. Prescription for Nicotrol inhaler every 3-4 hours as needed #150 Grant Woods- pre/post

## 2020-07-14 NOTE — Patient Instructions (Signed)
  Get back on your CPAP machine CPAP supplies will be renewed -including new mask and filters-DME is [facility] Prescription to add EPR/C-Flex setting of 3 and changed to auto CPAP 8 to 18 cm  Schedule CPAP titration study -okay to trial BiPAP if needed.  Referral to lung cancer screening program. Prescription for Nicotrol inhaler every 3-4 hours as needed #150

## 2020-07-15 ENCOUNTER — Ambulatory Visit: Payer: BC Managed Care – PPO | Admitting: Physician Assistant

## 2020-07-20 ENCOUNTER — Other Ambulatory Visit: Payer: Self-pay | Admitting: *Deleted

## 2020-07-20 DIAGNOSIS — F17210 Nicotine dependence, cigarettes, uncomplicated: Secondary | ICD-10-CM

## 2020-07-20 DIAGNOSIS — Z87891 Personal history of nicotine dependence: Secondary | ICD-10-CM

## 2020-07-21 DIAGNOSIS — R3121 Asymptomatic microscopic hematuria: Secondary | ICD-10-CM | POA: Diagnosis not present

## 2020-07-21 DIAGNOSIS — M545 Low back pain, unspecified: Secondary | ICD-10-CM | POA: Diagnosis not present

## 2020-07-21 DIAGNOSIS — Z125 Encounter for screening for malignant neoplasm of prostate: Secondary | ICD-10-CM | POA: Diagnosis not present

## 2020-07-23 DIAGNOSIS — G4733 Obstructive sleep apnea (adult) (pediatric): Secondary | ICD-10-CM | POA: Diagnosis not present

## 2020-07-28 ENCOUNTER — Other Ambulatory Visit (HOSPITAL_COMMUNITY)
Admission: RE | Admit: 2020-07-28 | Discharge: 2020-07-28 | Disposition: A | Discharge status: Home / Self Care | Payer: BC Managed Care – PPO | Source: Ambulatory Visit | Attending: Pulmonary Disease | Admitting: Pulmonary Disease

## 2020-07-28 DIAGNOSIS — Z01812 Encounter for preprocedural laboratory examination: Secondary | ICD-10-CM | POA: Insufficient documentation

## 2020-07-28 DIAGNOSIS — Z20822 Contact with and (suspected) exposure to covid-19: Secondary | ICD-10-CM | POA: Insufficient documentation

## 2020-07-28 LAB — SARS CORONAVIRUS 2 (TAT 6-24 HRS): SARS Coronavirus 2: NEGATIVE

## 2020-08-02 NOTE — Progress Notes (Deleted)
08/02/2020 Grant Woods [id number] 17-Jul-1970   CHIEF COMPLAINT:   HISTORY OF PRESENT ILLNESS:  Grant Woods. Rout is a 50 year old male with a past medical history of depression, drug abuse on Suboxone, back pain, severe obstructive sleep apnea uses CPAP, smoker, hematuria, gallstone pancreatitis 05/2016 s/p cholecystectomy. Past knee arthroscopy.    He presents to our office today as referred by Dr. Jimmey Woods to schedule a screening colonoscopy.   CBC Latest Ref Rng & Units 10/29/2019 08/03/2017 06/29/2016  WBC 4.0 - 10.5 K/uL 9.7 10.8(H) 17.5(H)  Hemoglobin 13.0 - 17.0 g/dL 12.4(L) 13.2 11.5(L)  Hematocrit 39.0 - 52.0 % 36.1(L) 38.4(L) 34.5(L)  Platelets 150.0 - 400.0 K/uL 283.0 338 230  MCV 88.5.   CMP Latest Ref Rng & Units 10/29/2019 08/03/2017 06/29/2016  Glucose 70 - 99 mg/dL 85 92 381(O)  BUN 6 - 23 mg/dL 22 20 11   Creatinine 0.40 - 1.50 mg/dL 1.75 1.02  Sodium 135 - 145 mEq/L 139 141 135  Potassium 3.5 - 5.1 mEq/L 3.8 3.5 3.2(L)  Chloride 96 - 112 mEq/L 105 104 102  CO2 19 - 32 mEq/L 27 27 22   Calcium 8.4 - 10.5 mg/dL 9.4 9.6 8.9  Total Protein 6.0 - 8.3 g/dL 6.8 7.9 6.6  Total Bilirubin 0.2 - 1.2 mg/dL 0.4 5.85) 0.9  Alkaline Phos 39 - 117 U/L 51 59 62  AST 0 - 37 U/L 15 21 47(H)  ALT 0 - 53 U/L 14 25 123(H)    Past Medical History:  Diagnosis Date  . Allergy   . Back pain   . Depression   . GERD (gastroesophageal reflux disease)   . Heroin use disorder, moderate, in sustained remission (HCC)   . MRSA (methicillin resistant Staphylococcus aureus)   . Opioid use disorder, moderate, on maintenance therapy [facility])    Past Surgical History:  Procedure Laterality Date  . CHOLECYSTECTOMY N/A 06/28/2016   Procedure: LAPAROSCOPIC CHOLECYSTECTOMY WITH INTRAOPERATIVE CHOLANGIOGRAM;  Surgeon: [facility], MD;  Location: [facility] OR;  Service: General;  Laterality: N/A;  . KNEE ARTHROSCOPY Right    Social History:   Family History:    reports that he has been  smoking. He has been smoking about 1.00 pack per day. He has never used smokeless tobacco. He reports that he does not drink alcohol and does not use drugs. family history includes Diabetes type II in his father and mother; Hypertension in his mother; Stroke in his mother. Allergies  Allergen Reactions  . Eggs Or Egg-Derived Products Anaphylaxis and Swelling  . Penicillins Hives    Has patient had a PCN reaction causing immediate rash, facial/tongue/throat swelling, SOB or lightheadedness with hypotension: unknown Has patient had a PCN reaction causing severe rash involving mucus membranes or skin necrosis: unknown Has patient had a PCN reaction that required hospitalization unknown Has patient had a PCN reaction occurring within the last 10 years: no If all of the above answers are "NO", then may proceed with Cephalosporin use.  Childhood allergy  . Dye Fdc Red [Red Dye]       Outpatient Encounter Medications as of 08/03/2020  Medication Sig  . Buprenorphine HCl-Naloxone HCl 5.7-1.4 MG SUBL Place 1 tablet under the tongue 2 (two) times daily. 6 AM and 4 PM  . ibuprofen (ADVIL) 800 MG tablet Take 1 tablet (800 mg total) by mouth 3 (three) times daily.  . Multiple Vitamin (MULTIVITAMIN WITH MINERALS) TABS tablet Take 1 tablet by mouth daily.  [facility]  nicotine (NICOTROL) 10 MG inhaler Inhale 1 Cartridge (1 continuous puffing total) into the lungs as needed for smoking cessation.  Marland Kitchen POTASSIUM GLUCONATE PO Take 1 tablet by mouth daily.  . tadalafil (CIALIS) 10 MG tablet Take 0.5-1 tablets (5-10 mg total) by mouth daily as needed for erectile dysfunction.   No facility-administered encounter medications on file as of 08/03/2020.    REVIEW OF SYSTEMS:   Gen: Denies fever, sweats or chills. No weight loss.  CV: Denies chest pain, palpitations or edema. Resp: Denies cough, shortness of breath of hemoptysis.  GI: Denies heartburn, dysphagia, stomach or lower abdominal pain. No diarrhea or  constipation.  GU : Denies urinary burning, blood in urine, increased urinary frequency or incontinence. MS: Denies joint pain, muscles aches or weakness. Derm: Denies rash, itchiness, skin lesions or unhealing ulcers. Psych: Denies depression, anxiety, memory loss, suicidal ideation and confusion. Heme: Denies bruising, bleeding. Neuro:  Denies headaches, dizziness or paresthesias. Endo:  Denies any problems with DM, thyroid or adrenal function.    PHYSICAL EXAM: There were no vitals taken for this visit. General: Well developed ... in no acute distress. Head: Normocephalic and atraumatic. Eyes:  Sclerae non-icteric, conjunctive pink. Ears: Normal auditory acuity. Mouth: Dentition intact. No ulcers or lesions.  Neck: Supple, no lymphadenopathy or thyromegaly.  Lungs: Clear bilaterally to auscultation without wheezes, crackles or rhonchi. Heart: Regular rate and rhythm. No murmur, rub or gallop appreciated.  Abdomen: Soft, nontender, non distended. No masses. No hepatosplenomegaly. Normoactive bowel sounds x 4 quadrants.  Rectal:  Musculoskeletal: Symmetrical with no gross deformities. Skin: Warm and dry. No rash or lesions on visible extremities. Extremities: No edema. Neurological: Alert oriented x 4, no focal deficits.  Psychological:  Alert and cooperative. Normal mood and affect.  ASSESSMENT AND PLAN:  23. 50 year old male presents to schedule a screening colonoscopy. -Colonoscopy benefits and risks discussed including risk with sedation, risk of bleeding, perforation and infection   2. Anemia -EGD at time of colonoscopy  -CBC, iron, iron saturation, TIBC,Ferritin, B12  3. Severe OSA on CPAP  4. History of opioid abuse on Suboxone   5. Smoker. Screening chest CT scheduled 08/2020.       CC:  Ardith Dark, MD

## 2020-08-03 ENCOUNTER — Ambulatory Visit: Payer: BC Managed Care – PPO | Admitting: Nurse Practitioner

## 2020-08-14 ENCOUNTER — Other Ambulatory Visit: Payer: Self-pay

## 2020-08-14 ENCOUNTER — Ambulatory Visit (INDEPENDENT_AMBULATORY_CARE_PROVIDER_SITE_OTHER): Payer: BC Managed Care – PPO | Admitting: Primary Care

## 2020-08-14 ENCOUNTER — Encounter: Payer: Self-pay | Admitting: Primary Care

## 2020-08-14 DIAGNOSIS — F17210 Nicotine dependence, cigarettes, uncomplicated: Secondary | ICD-10-CM

## 2020-08-14 DIAGNOSIS — F17200 Nicotine dependence, unspecified, uncomplicated: Secondary | ICD-10-CM

## 2020-08-14 NOTE — Progress Notes (Addendum)
Virtual Visit via Video Note  I connected with Grant Woods on 08/14/20 at 10:00 AM EDT by a video enabled telemedicine application and verified that I am speaking with the correct person using two identifiers.  Location: Patient: Home Provider: Office   I discussed the limitations of evaluation and management by telemedicine and the availability of in person appointments. The patient expressed understanding and agreed to proceed.    Shared Decision Making Visit Lung Cancer Screening Program [phone])   Eligibility:  Age 49 y.o.  Pack Years Smoking History Calculation 25 (# packs/per year x # years smoked)  Recent History of coughing up blood  no  Unexplained weight loss? no ( >Than 15 pounds within the last 6 months )  Prior History Lung / other cancer no (Diagnosis within the last 5 years already requiring surveillance chest CT Scans).  Smoking Status Current Smoker  Former Smokers: Years since quit: < 1 year  Quit Date: N/A  Visit Components:  Discussion included one or more decision making aids. yes  Discussion included risk/benefits of screening. yes  Discussion included potential follow up diagnostic testing for abnormal scans. yes  Discussion included meaning and risk of over diagnosis. yes  Discussion included meaning and risk of False Positives. yes  Discussion included meaning of total radiation exposure. yes  Counseling Included:  Importance of adherence to annual lung cancer LDCT screening. yes  Impact of comorbidities on ability to participate in the program. yes  Ability and willingness to under diagnostic treatment. yes  Smoking Cessation Counseling:  Current Smokers:   Discussed importance of smoking cessation. yes  Information about tobacco cessation classes and interventions provided to patient. yes  Patient provided with "ticket" for LDCT Scan. yes  Symptomatic Patient. no  Counseling(Intermediate counseling: > three minutes)  [id number]  Diagnosis Code: Tobacco Use Z72.0  Asymptomatic Patient yes  Counseling (Intermediate counseling: > three minutes counseling) [id number]  Former Smokers:   Discussed the importance of maintaining cigarette abstinence. yes  Diagnosis Code: Personal History of Nicotine Dependence. D66.440  Information about tobacco cessation classes and interventions provided to patient. Yes  Patient provided with "ticket" for LDCT Scan. yes  Written Order for Lung Cancer Screening with LDCT placed in Epic. Yes (CT Chest Lung Cancer Screening Low Dose W/O CM) [id number] Z12.2-Screening of respiratory organs Z87.891-Personal history of nicotine dependence  I have spent 25 minutes with Grant Woods discussing the risks and benefits of lung cancer screening. We viewed a power point together that explained in detail the above noted topics. We paused at intervals to allow for questions to be asked and answered to ensure understanding.We discussed that the single most powerful action that he can take to decrease his risk of developing lung cancer is to quit smoking. We discussed whether or not he is ready to commit to setting a quit date. We discussed options for tools to aid in quitting smoking including nicotine replacement therapy, non-nicotine medications, support groups, Quit Smart classes, and behavior modification. We discussed that often times setting smaller, more achievable goals, such as eliminating 1 cigarette a day for a week and then 2 cigarettes a day for a week can be helpful in slowly decreasing the number of cigarettes smoked. This allows for a sense of accomplishment as well as providing a clinical benefit. I gave him the " Be Stronger Than Your Excuses" card with contact information for community resources, classes, free nicotine replacement therapy, and access to mobile apps, text messaging, and on-line smoking cessation  help. I have also given him my card and contact information in the event he needs  to contact me. We discussed the time and location of the scan, and that either Abigail Miyamoto RN or I will call with the results within 24-48 hours of receiving them. I have offered him  a copy of the power point we viewed  as a resource in the event they need reinforcement of the concepts we discussed today in the office. The patient verbalized understanding of all of  the above and had no further questions upon leaving the office. They have my contact information in the event they have any further questions.  I spent 3-5 minutes counseling on smoking cessation and the health risks of continued tobacco abuse.  I explained to the patient that there has been a high incidence of coronary artery disease noted on these exams. I explained that this is a non-gated exam therefore degree or severity cannot be determined. This patient is not on statin therapy. I have asked the patient to follow-up with their PCP regarding any incidental finding of coronary artery disease and management with diet or medication as their PCP  feels is clinically indicated. The patient verbalized understanding of the above and had no further questions upon completion of the visit.    Glenford Bayley, NP

## 2020-08-14 NOTE — Patient Instructions (Signed)
Thank you for participating in the [place] Scre[facility]. It was our pleasure to meet you today. We will call you with the results of your scan within the next few days. Your scan will be assigned a Lung RADS category score by the physicians reading the scans.  This Lung RADS score determines follow up scanning.  See below for description of categories, and follow up screening recommendations. We will be in touch to schedule your follow up screening annually or based on recommendations of our providers. We will fax a copy of your scan results to your Primary Care Physician, or the physician who referred you to the program, to ensure they have the results. Please call the office if you have any questions or concerns regarding your scanning experience or results.  Our office number is [phone]. Please speak with Denise Phelps, RN. She is our Lung Cancer Screening RN. If she is unavailable when you call, please have the office staff send her a message. She will return your call at her earliest convenience. Remember, if your scan is normal, we will scan you annually as long as you continue to meet the criteria for the program. (Age 55-77, Current smoker or smoker who has quit within the last 15 years). If you are a smoker, remember, quitting is the single most powerful action that you can take to decrease your risk of lung cancer and other pulmonary, breathing related problems. We know quitting is hard, and we are here to help.  Please let us know if there is anything we can do to help you meet your goal of quitting. If you are a former smoker, congratulations. We are proud of you! Remain smoke free! Remember you can refer friends or family members through the number above.  We will screen them to make sure they meet criteria for the program. Thank you for helping us take better care of you by participating in Lung Screening.  Lung RADS Categories:  Lung RADS 1: no nodules  or definitely non-concerning nodules.  Recommendation is for a repeat annual scan in 12 months.  Lung RADS 2:  nodules that are non-concerning in appearance and behavior with a very low likelihood of becoming an active cancer. Recommendation is for a repeat annual scan in 12 months.  Lung RADS 3: nodules that are probably non-concerning , includes nodules with a low likelihood of becoming an active cancer.  Recommendation is for a 6-month repeat screening scan. Often noted after an upper respiratory illness. We will be in touch to make sure you have no questions, and to schedule your 6-month scan.  Lung RADS 4 A: nodules with concerning findings, recommendation is most often for a follow up scan in 3 months or additional testing based on our provider's assessment of the scan. We will be in touch to make sure you have no questions and to schedule the recommended 3 month follow up scan.  Lung RADS 4 B:  indicates findings that are concerning. We will be in touch with you to schedule additional diagnostic testing based on our provider's  assessment of the scan.   

## 2020-08-17 ENCOUNTER — Ambulatory Visit
Admission: RE | Admit: 2020-08-17 | Discharge: 2020-08-17 | Disposition: A | Discharge status: Home / Self Care | Payer: BC Managed Care – PPO | Source: Ambulatory Visit | Attending: Acute Care | Admitting: Acute Care

## 2020-08-17 ENCOUNTER — Encounter: Payer: BC Managed Care – PPO | Admitting: Acute Care

## 2020-08-17 DIAGNOSIS — F17210 Nicotine dependence, cigarettes, uncomplicated: Secondary | ICD-10-CM

## 2020-08-17 DIAGNOSIS — F1120 Opioid dependence, uncomplicated: Secondary | ICD-10-CM | POA: Diagnosis not present

## 2020-08-17 DIAGNOSIS — Z87891 Personal history of nicotine dependence: Secondary | ICD-10-CM

## 2020-08-18 ENCOUNTER — Other Ambulatory Visit: Payer: Self-pay | Admitting: Urology

## 2020-08-18 DIAGNOSIS — R3129 Other microscopic hematuria: Secondary | ICD-10-CM

## 2020-08-25 NOTE — Progress Notes (Signed)
Please call patient and let them  know their  low dose Ct was read as a Lung RADS 2: nodules that are benign in appearance and behavior with a very low likelihood of becoming a clinically active cancer due to size or lack of growth. Recommendation per radiology is for a repeat LDCT in 12 months. .Please let them  know we will order and schedule their  annual screening scan for 08/2021 Please let them  know there was notation of CAD on their  scan.  Please remind the patient  that this is a non-gated exam therefore degree or severity of disease  cannot be determined. Please have them  follow up with their PCP regarding potential risk factor modification, dietary therapy or pharmacologic therapy if clinically indicated. Pt.  is not  currently on statin therapy. Please place order for annual  screening scan for  08/2021 and fax results to PCP. Thanks so much. 

## 2020-08-27 ENCOUNTER — Encounter: Payer: Self-pay | Admitting: *Deleted

## 2020-08-27 DIAGNOSIS — Z87891 Personal history of nicotine dependence: Secondary | ICD-10-CM

## 2020-08-27 DIAGNOSIS — F17210 Nicotine dependence, cigarettes, uncomplicated: Secondary | ICD-10-CM

## 2020-08-28 DIAGNOSIS — R3121 Asymptomatic microscopic hematuria: Secondary | ICD-10-CM | POA: Diagnosis not present

## 2020-08-31 ENCOUNTER — Other Ambulatory Visit (HOSPITAL_COMMUNITY): Payer: BC Managed Care – PPO

## 2020-09-02 ENCOUNTER — Telehealth: Payer: Self-pay | Admitting: Pulmonary Disease

## 2020-09-02 NOTE — Telephone Encounter (Signed)
Spoke with pt's wife (per DPR) r/t sleep titration study and it's results. Pt's wife stated that a [facility] that she had talked to had stated Dr. Vassie Loll gets it's own results and reads it immediately. Pt's wife stated nothing further needed at this time.

## 2020-09-10 ENCOUNTER — Encounter (HOSPITAL_BASED_OUTPATIENT_CLINIC_OR_DEPARTMENT_OTHER): Payer: BC Managed Care – PPO | Admitting: Pulmonary Disease

## 2020-09-11 ENCOUNTER — Ambulatory Visit: Payer: BC Managed Care – PPO | Admitting: Pulmonary Disease

## 2020-09-11 ENCOUNTER — Other Ambulatory Visit (HOSPITAL_COMMUNITY)
Admission: RE | Admit: 2020-09-11 | Discharge: 2020-09-11 | Disposition: A | Discharge status: Home / Self Care | Payer: BC Managed Care – PPO | Source: Ambulatory Visit | Attending: Pulmonary Disease | Admitting: Pulmonary Disease

## 2020-09-11 DIAGNOSIS — Z01812 Encounter for preprocedural laboratory examination: Secondary | ICD-10-CM | POA: Insufficient documentation

## 2020-09-11 DIAGNOSIS — Z20822 Contact with and (suspected) exposure to covid-19: Secondary | ICD-10-CM | POA: Diagnosis not present

## 2020-09-11 LAB — SARS CORONAVIRUS 2 (TAT 6-24 HRS): SARS Coronavirus 2: NEGATIVE

## 2020-09-14 ENCOUNTER — Ambulatory Visit (HOSPITAL_BASED_OUTPATIENT_CLINIC_OR_DEPARTMENT_OTHER): Payer: BC Managed Care – PPO | Attending: Pulmonary Disease | Admitting: Pulmonary Disease

## 2020-09-14 ENCOUNTER — Other Ambulatory Visit: Payer: Self-pay

## 2020-09-14 DIAGNOSIS — G4733 Obstructive sleep apnea (adult) (pediatric): Secondary | ICD-10-CM | POA: Diagnosis not present

## 2020-09-14 DIAGNOSIS — G4730 Sleep apnea, unspecified: Secondary | ICD-10-CM | POA: Diagnosis not present

## 2020-09-14 DIAGNOSIS — F17200 Nicotine dependence, unspecified, uncomplicated: Secondary | ICD-10-CM

## 2020-09-15 ENCOUNTER — Telehealth: Payer: Self-pay | Admitting: Pulmonary Disease

## 2020-09-15 ENCOUNTER — Ambulatory Visit (INDEPENDENT_AMBULATORY_CARE_PROVIDER_SITE_OTHER): Payer: BC Managed Care – PPO | Admitting: Pulmonary Disease

## 2020-09-15 DIAGNOSIS — F17200 Nicotine dependence, unspecified, uncomplicated: Secondary | ICD-10-CM

## 2020-09-15 DIAGNOSIS — G4730 Sleep apnea, unspecified: Secondary | ICD-10-CM

## 2020-09-15 LAB — PULMONARY FUNCTION TEST
FEF 25-75 Post: 3.51 L/sec
FEF 25-75 Pre: 2.66 L/sec
FEF2575-%Change-Post: 32 %
FEF2575-%Pred-Post: 94 %
FEF2575-%Pred-Pre: 71 %
FEV1-%Change-Post: 5 %
FEV1-%Pred-Post: 85 %
FEV1-%Pred-Pre: 80 %
FEV1-Post: 3.28 L
FEV1-Pre: 3.1 L
FEV1FVC-%Change-Post: 3 %
FEV1FVC-%Pred-Pre: 97 %
FEV6-%Change-Post: 3 %
FEV6-%Pred-Post: 85 %
FEV6-%Pred-Pre: 83 %
FEV6-Post: 4.03 L
FEV6-Pre: 3.9 L
FEV6FVC-%Change-Post: 1 %
FEV6FVC-%Pred-Post: 102 %
FEV6FVC-%Pred-Pre: 101 %
FVC-%Change-Post: 2 %
FVC-%Pred-Post: 83 %
FVC-%Pred-Pre: 82 %
FVC-Post: 4.05 L
Post FEV1/FVC ratio: 81 %
Post FEV6/FVC ratio: 100 %
Pre FEV1/FVC ratio: 78 %
Pre FEV6/FVC Ratio: 99 %

## 2020-09-15 NOTE — Telephone Encounter (Signed)
CPAP ttiration showed pressure of 13 cm We had sent in RX earlier to [facility] to change to autoCPAP 8-18 cm & those settings are ok. Hope to review download on FU visit

## 2020-09-15 NOTE — Progress Notes (Signed)
Spirometry pre and post done today. 

## 2020-09-15 NOTE — Procedures (Signed)
Patient Name: Grant Woods, Grant Woods Date: 09/14/2020 Gender: Male D.O.B: 07-19-1970 Age (years): 49 Referring Provider: Cyril Mourning MD, ABSM Height (inches): 73 Interpreting Physician: Cyril Mourning MD, ABSM Weight (lbs): 230 RPSGT: [place] BMI: 30 MRN: [id number] Neck Size: 16.00 <br> <br> CLINICAL INFORMATION The patient is referred for a CPAP titration to treat sleep apnea.    Date of NPSG: 11/2016 - severe OSA with AHI 98/hour and lowest desaturation of 84%.   SLEEP STUDY TECHNIQUE As per the AASM Manual for the Scoring of Sleep and Associated Events v2.3 (April 2016) with a hypopnea requiring 4% desaturations.  The channels recorded and monitored were frontal, central and occipital EEG, electrooculogram (EOG), submentalis EMG (chin), nasal and oral airflow, thoracic and abdominal wall motion, anterior tibialis EMG, snore microphone, electrocardiogram, and pulse oximetry. Continuous positive airway pressure (CPAP) was initiated at the beginning of the study and titrated to treat sleep-disordered breathing.  MEDICATIONS Medications self-administered by patient taken the night of the study : N/A  TECHNICIAN COMMENTS Comments added by technician: None Comments added by scorer: N/A RESPIRATORY PARAMETERS Optimal PAP Pressure (cm): 13 AHI at Optimal Pressure (/hr): 0 Overall Minimal O2 (%): 89.0 Supine % at Optimal Pressure (%): 19 Minimal O2 at Optimal Pressure (%): 93.0   SLEEP ARCHITECTURE The study was initiated at 9:18:32 AM and ended at 3:31:00 PM.  Sleep onset time was 8.5 minutes and the sleep efficiency was 88.1%%. The total sleep time was 328 minutes.  The patient spent 13.7%% of the night in stage N1 sleep, 70.0%% in stage N2 sleep, 0.0%% in stage N3 and 16.3% in REM.Stage REM latency was 83.0 minutes  Wake after sleep onset was 36.0. Alpha intrusion was absent. Supine sleep was 38.87%.  CARDIAC DATA The 2 lead EKG demonstrated sinus rhythm. The mean  heart rate was 53.1 beats per minute. Other EKG findings include: None.  LEG MOVEMENT DATA The total Periodic Limb Movements of Sleep (PLMS) were 0. The PLMS index was 0.0. A PLMS index of <15 is considered normal in adults.  IMPRESSIONS - The optimal PAP pressure was 13 cm of water. - Mild oxygen desaturations were observed during this titration (min O2 = 89.0%). - The patient snored with loud snoring volume during this titration study. - No cardiac abnormalities were observed during this study. - Clinically significant periodic limb movements were not noted during this study. Arousals associated with PLMs were rare.  DIAGNOSIS - Obstructive Sleep Apnea (G47.33)  RECOMMENDATIONS - Trial of CPAP therapy on 13 cm H2O with a Medium Wide size Philips Respironics Full Face Mask Dreamwear mask and heated humidification. - Avoid alcohol, sedatives and other CNS depressants that may worsen sleep apnea and disrupt normal sleep architecture. - Sleep hygiene should be reviewed to assess factors that may improve sleep quality. - Weight management and regular exercise should be initiated or continued. - Return to Sleep Center for re-evaluation after 4 weeks of therapy   Cyril Mourning MD Board Certified in Sleep medicine

## 2020-09-16 DIAGNOSIS — F1120 Opioid dependence, uncomplicated: Secondary | ICD-10-CM | POA: Diagnosis not present

## 2020-09-16 NOTE — Telephone Encounter (Signed)
Called and went over Cpap titration note from Dr Vassie Loll with patient wife, Grant Woods per DPR. All questions answered and wife expressed full understanding. Wife confirmed office visit with Dr Vassie Loll on 09/17/20 at 9am. Nothing further needed at this time.

## 2020-09-17 ENCOUNTER — Other Ambulatory Visit: Payer: Self-pay

## 2020-09-17 ENCOUNTER — Encounter: Payer: Self-pay | Admitting: Pulmonary Disease

## 2020-09-17 ENCOUNTER — Ambulatory Visit (INDEPENDENT_AMBULATORY_CARE_PROVIDER_SITE_OTHER): Payer: BC Managed Care – PPO | Admitting: Pulmonary Disease

## 2020-09-17 VITALS — BP 110/62 | HR 74 | Temp 97.5°F | Ht 74.0 in | Wt 233.4 lb

## 2020-09-17 DIAGNOSIS — G4730 Sleep apnea, unspecified: Secondary | ICD-10-CM | POA: Diagnosis not present

## 2020-09-17 DIAGNOSIS — Z720 Tobacco use: Secondary | ICD-10-CM | POA: Diagnosis not present

## 2020-09-17 DIAGNOSIS — R911 Solitary pulmonary nodule: Secondary | ICD-10-CM | POA: Insufficient documentation

## 2020-09-17 NOTE — Patient Instructions (Signed)
You have to QUIT smoking !! CT Chest in 1 year DME - [employer] , need download , make sure they have made adjustments auto 8-18 cm , provide supplies incl filters etc

## 2020-09-17 NOTE — Assessment & Plan Note (Signed)
We discussed results of low-dose CT showing right upper lobe 5 mm nodule, repeat CT chest low-dose follow-up in 1 year

## 2020-09-17 NOTE — Progress Notes (Signed)
   Subjective:    Patient ID: Grant Woods, male    DOB: [birth year], 50 y.o.   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  He complained of high pressure .   DME-[employer]  Last OV 07/2020 >> get back on autoCPAP 8-18 cm He continues to smoke about a pack per day, complains of mild shortness of breath. We reviewed PFTs, CT chest and CPAP titration study. He has resumed CPAP usage after his last visit, he is still not been able to obtain supplies.  He is tolerating pressure much better, pain or bloating is much as he did the first time around. He reports intermittent reflux symptoms    Significant tests/ events reviewed  Spirometry 09/2020 ratio 78, FEV1 3.10/80%, FVC 82%  NPSG  11/2016 - severe OSA with AHI 98/hour and lowest desaturation of 84%. CPAP did not control events and he was started on BiPAP and corrected with 16/11 with a large full facemask  CPAP titration 09/2020 >> 13 cm  LDCT 08/2020 >> RUL nodule 76mm, mild emphysema , Esophageal air fluid level suggests dysmotility or gastroesophageal reflux    Review of Systems neg for any significant sore throat, dysphagia, itching, sneezing, nasal congestion or excess/ purulent secretions, fever, chills, sweats, unintended wt loss, pleuritic or exertional cp, hempoptysis, orthopnea pnd or change in chronic leg swelling. Also denies presyncope, palpitations, heartburn, abdominal pain, nausea, vomiting, diarrhea or change in bowel or urinary habits, dysuria,hematuria, rash, arthralgias, visual complaints, headache, numbness weakness or ataxia.     Objective:   Physical Exam  Gen. Pleasant, obese, in no distress ENT - no lesions, no post nasal drip Neck: No JVD, no thyromegaly, no carotid bruits Lungs: no use of accessory  muscles, no dullness to percussion, decreased without rales or rhonchi  Cardiovascular: Rhythm regular, heart sounds  normal, no murmurs or gallops, no peripheral edema Musculoskeletal: No deformities, no cyanosis or clubbing , no tremors       Assessment & Plan:

## 2020-09-17 NOTE — Assessment & Plan Note (Signed)
He is more compliant by history. Unfortunately download is not available today.  We will try to obtain this from his DME. We have changed him to auto CPAP settings 8 to 18 cm, we reviewed repeat titration study which showed  13 cm does suffice .  After review of download we can reduce auto settings to 8 to 15 cm

## 2020-09-17 NOTE — Assessment & Plan Note (Signed)
We discussed spirometry results Used this to try to motivate him to quit smoking. Nicotrol inhaler was prescribed last visit but he has not started using this

## 2020-09-23 ENCOUNTER — Encounter: Payer: Self-pay | Admitting: Family Medicine

## 2020-09-23 ENCOUNTER — Telehealth (INDEPENDENT_AMBULATORY_CARE_PROVIDER_SITE_OTHER): Payer: BC Managed Care – PPO | Admitting: Family Medicine

## 2020-09-23 ENCOUNTER — Other Ambulatory Visit: Payer: Self-pay

## 2020-09-23 DIAGNOSIS — U071 COVID-19: Secondary | ICD-10-CM | POA: Diagnosis not present

## 2020-09-23 MED ORDER — NIRMATRELVIR/RITONAVIR (PAXLOVID)TABLET: 3.0000 | ORAL_TABLET | Freq: Two times a day (BID) | ORAL | 0 refills | Status: AC

## 2020-09-23 NOTE — Progress Notes (Signed)
Chief Complaint  Patient presents with   Covid Positive    Test positive today 09/23/20.    Fever    f   Fatigue    Horald Chestnut here for URI complaints. Due to COVID-19 pandemic, we are interacting via web portal for an electronic face-to-face visit. I verified patient's ID using 2 identifiers. Patient agreed to proceed with visit via this method. Patient is at home, I am at office. Patient, spouse and I are present for visit.   Duration: 1 day Associated symptoms: Fever (subjective), sinus headache, myalgia, and fatigue, decreased sense of taste Denies: sinus congestion, sinus pain, rhinorrhea, itchy watery eyes, ear drainage, sore throat, wheezing, shortness of breath, and cough Treatment to date: Tylenol Sick contacts: Yes- coworker tested positive Tested + today.   Past Medical History:  Diagnosis Date   Allergy    Back pain    Depression    GERD (gastroesophageal reflux disease)    Heroin use disorder, moderate, in sustained remission (HCC)    MRSA (methicillin resistant Staphylococcus aureus)    Opioid use disorder, moderate, on maintenance therapy (HCC)    Objective No conversational dyspnea Age appropriate judgment and insight Nml affect and mood  COVID-19 - Plan: nirmatrelvir/ritonavir EUA (PAXLOVID) TABS  Given hx of OSA, Aortic atherosclerosis, emphysema, will do a 5 d course of above. Spoke w pharmacy team regarding bup he is on, no need to d/c or decrease dosing intervals.  Continue to push fluids, practice good hand hygiene, cover mouth when coughing. F/u prn. If starting to experience fevers, shaking, or shortness of breath, seek immediate care. Pt voiced understanding and agreement to the plan.  Jilda Roche Vanderbilt, DO 09/23/20 11:47 AM

## 2020-11-10 DIAGNOSIS — F1120 Opioid dependence, uncomplicated: Secondary | ICD-10-CM | POA: Diagnosis not present

## 2021-01-06 DIAGNOSIS — F1120 Opioid dependence, uncomplicated: Secondary | ICD-10-CM | POA: Diagnosis not present

## 2021-02-03 DIAGNOSIS — F1120 Opioid dependence, uncomplicated: Secondary | ICD-10-CM | POA: Diagnosis not present

## 2021-05-24 DIAGNOSIS — Z20822 Contact with and (suspected) exposure to covid-19: Secondary | ICD-10-CM | POA: Diagnosis not present

## 2021-07-28 DIAGNOSIS — F1120 Opioid dependence, uncomplicated: Secondary | ICD-10-CM | POA: Diagnosis not present

## 2021-08-17 ENCOUNTER — Other Ambulatory Visit: Payer: Self-pay | Admitting: *Deleted

## 2021-08-17 ENCOUNTER — Other Ambulatory Visit: Payer: BC Managed Care – PPO

## 2021-08-17 DIAGNOSIS — F17210 Nicotine dependence, cigarettes, uncomplicated: Secondary | ICD-10-CM

## 2021-08-17 DIAGNOSIS — Z87891 Personal history of nicotine dependence: Secondary | ICD-10-CM

## 2021-08-17 DIAGNOSIS — Z122 Encounter for screening for malignant neoplasm of respiratory organs: Secondary | ICD-10-CM

## 2021-08-24 DIAGNOSIS — G4733 Obstructive sleep apnea (adult) (pediatric): Secondary | ICD-10-CM | POA: Diagnosis not present

## 2021-08-27 ENCOUNTER — Emergency Department (HOSPITAL_BASED_OUTPATIENT_CLINIC_OR_DEPARTMENT_OTHER)
Admission: EM | Admit: 2021-08-27 | Discharge: 2021-08-27 | Disposition: A | Discharge status: Home / Self Care | Payer: BC Managed Care – PPO | Attending: Emergency Medicine | Admitting: Emergency Medicine

## 2021-08-27 ENCOUNTER — Other Ambulatory Visit: Payer: Self-pay

## 2021-08-27 ENCOUNTER — Encounter (HOSPITAL_BASED_OUTPATIENT_CLINIC_OR_DEPARTMENT_OTHER): Payer: Self-pay | Admitting: Emergency Medicine

## 2021-08-27 ENCOUNTER — Emergency Department (HOSPITAL_BASED_OUTPATIENT_CLINIC_OR_DEPARTMENT_OTHER): Payer: BC Managed Care – PPO

## 2021-08-27 DIAGNOSIS — M545 Low back pain, unspecified: Secondary | ICD-10-CM | POA: Diagnosis not present

## 2021-08-27 DIAGNOSIS — M940 Chondrocostal junction syndrome [Tietze]: Secondary | ICD-10-CM | POA: Diagnosis not present

## 2021-08-27 DIAGNOSIS — R0602 Shortness of breath: Secondary | ICD-10-CM | POA: Diagnosis not present

## 2021-08-27 MED ORDER — METHOCARBAMOL 500 MG PO TABS
750.0000 mg | ORAL_TABLET | Freq: Once | ORAL | Status: AC
Start: 2021-08-27 — End: 2021-08-27
  Administered 2021-08-27: 750 mg via ORAL
  Filled 2021-08-27: qty 2

## 2021-08-27 MED ORDER — METHOCARBAMOL 500 MG PO TABS: 500.0000 mg | ORAL_TABLET | Freq: Two times a day (BID) | ORAL | 0 refills | Status: DC

## 2021-08-27 MED ORDER — KETOROLAC TROMETHAMINE 30 MG/ML IJ SOLN
30.0000 mg | Freq: Once | INTRAMUSCULAR | Status: AC
  Administered 2021-08-27: 30 mg via INTRAMUSCULAR
  Filled 2021-08-27: qty 1

## 2021-08-27 MED ORDER — IBUPROFEN 600 MG PO TABS: 600.0000 mg | ORAL_TABLET | Freq: Four times a day (QID) | ORAL | 0 refills | Status: AC | PRN

## 2021-08-27 NOTE — ED Provider Notes (Signed)
[facility] EMERGENCY DEPT Provider Note   CSN: [id number] Arrival date & time: 08/27/21  [id number]     History  Chief Complaint  Patient presents with   Back Pain    Grant Woods is a 51 y.o. male.  Patient is a 51 year old male with no significant past medical history presenting for complaints of back pain.  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  States pain is severe and worse with movement or deep breathing.  Denies any chest pain.  Denies any diaphoresis or nausea.  Denies any history of DVT or pulmonary embolism.  The history is provided by the patient. No language interpreter was used.  Back Pain Associated symptoms: no abdominal pain, no chest pain, no dysuria and no fever       Home Medications Prior to Admission medications   Medication Sig Start Date End Date Taking? Authorizing Provider  ibuprofen (ADVIL) 600 MG tablet Take 1 tablet (600 mg total) by mouth every 6 (six) hours as needed. 08/27/21  Yes Edwin Dada P, DO  methocarbamol (ROBAXIN) 500 MG tablet Take 1 tablet (500 mg total) by mouth 2 (two) times daily. 08/27/21  Yes Edwin Dada P, DO  Buprenorphine HCl-Naloxone HCl 5.7-1.4 MG SUBL Place 1 tablet under the tongue 2 (two) times daily. 6 AM and 4 PM    [provider]  Multiple Vitamin (MULTIVITAMIN WITH MINERALS) TABS tablet Take 1 tablet by mouth daily.    [provider]  nicotine (NICOTROL) 10 MG inhaler Inhale 1 Cartridge (1 continuous puffing total) into the lungs as needed for smoking cessation. 07/14/20   Oretha Milch, MD  POTASSIUM GLUCONATE PO Take 1 tablet by mouth daily.    [provider]  tadalafil (CIALIS) 10 MG tablet Take 0.5-1 tablets (5-10 mg total) by mouth daily as needed for erectile dysfunction. 06/22/20   Ardith Dark, MD      Allergies    Eggs or egg-derived products, Penicillins, and Dye  fdc red [red dye]    Review of Systems   Review of Systems  Constitutional:  Negative for chills and fever.  HENT:  Negative for ear pain and sore throat.   Eyes:  Negative for pain and visual disturbance.  Respiratory:  Negative for cough and shortness of breath.   Cardiovascular:  Negative for chest pain and palpitations.  Gastrointestinal:  Negative for abdominal pain and vomiting.  Genitourinary:  Negative for dysuria and hematuria.  Musculoskeletal:  Positive for back pain. Negative for arthralgias.  Skin:  Negative for color change and rash.  Neurological:  Negative for seizures and syncope.  All other systems reviewed and are negative.  Physical Exam Updated Vital Signs BP 114/75   Pulse 71   Temp 98.4 F (36.9 C)   Resp 18   Ht 6\' 2"  (1.88 m)   Wt 106 kg   SpO2 100%   BMI 30.00 kg/m  Physical Exam Vitals and nursing note reviewed.  Constitutional:      General: He is not in acute distress.    Appearance: He is well-developed.  HENT:     Head: Normocephalic and atraumatic.  Eyes:     Conjunctiva/sclera: Conjunctivae normal.  Cardiovascular:     Rate and Rhythm: Normal rate and regular rhythm.     Heart sounds: No murmur heard. Pulmonary:     Effort: Pulmonary effort  is normal. No respiratory distress.     Breath sounds: Normal breath sounds.  Abdominal:     Palpations: Abdomen is soft.     Tenderness: There is no abdominal tenderness.  Musculoskeletal:        General: No swelling.     Cervical back: Neck supple. No bony tenderness.     Thoracic back: Tenderness present. No bony tenderness.     Lumbar back: No tenderness or bony tenderness.       Back:  Skin:    General: Skin is warm and dry.     Capillary Refill: Capillary refill takes less than 2 seconds.  Neurological:     Mental Status: He is alert.     GCS: GCS eye subscore is 4. GCS verbal subscore is 5. GCS motor subscore is 6.     Sensory: Sensation is intact.     Motor: Motor function is  intact.  Psychiatric:        Mood and Affect: Mood normal.    ED Results / Procedures / Treatments   Labs (all labs ordered are listed, but only abnormal results are displayed) Labs Reviewed - No data to display  EKG None  Radiology DG Chest Portable 1 View  Result Date: 08/27/2021 CLINICAL DATA:  Shortness of breath. EXAM: PORTABLE CHEST 1 VIEW COMPARISON:  Chest CT 08/17/2020 FINDINGS: The heart size and mediastinal contours are within normal limits. Both lungs are clear. The visualized skeletal structures are unremarkable apart from slight thoracic dextroscoliosis. IMPRESSION: No active disease. Comparison to prior study reveals no significant interval change. Electronically Signed   By: Almira BarKeith  Chesser M.D.   On: 08/27/2021 07:40    Procedures Procedures    Medications Ordered in ED Medications  methocarbamol (ROBAXIN) tablet 750 mg (750 mg Oral Given 08/27/21 0736)  ketorolac (TORADOL) 30 MG/ML injection 30 mg (30 mg Intramuscular Given 08/27/21 [id number])    ED Course/ Medical Decision Making/ A&P                           Medical Decision Making Amount and/or Complexity of Data Reviewed Radiology: ordered.  Risk Prescription drug management.   [id number] AM  51 year old male with no significant past medical history presenting for complaints of back pain.  Patient is alert and oriented x3, no acute distress, afebrile, stable vital signs.  Physical exam tenderness palpation of posterior ribs 7 through 8.  Patient has equal bilateral breath sounds with no adventitious lung sounds.  Chest x-ray demonstrates no acute process.  Patient given Robaxin and IM Toradol for likely pain.  Xray demonstrates no pneumothorax. Pt is PERC criteria negative. Doubt PE. Pain is reproducible with deep breathing and movement. Likely musculoskeletal in nature. Medication for pain control sent to pharmacy.   Patient in no distress and overall condition improved here in the ED. Detailed discussions were  had with the patient regarding current findings, and need for close f/u with PCP or on call doctor. The patient has been instructed to return immediately if the symptoms worsen in any way for re-evaluation. Patient verbalized understanding and is in agreement with current care plan. All questions answered prior to discharge.          Final Clinical Impression(s) / ED Diagnoses Final diagnoses:  Costochondritis, acute    Rx / DC Orders ED Discharge Orders          Ordered    methocarbamol (ROBAXIN) 500 MG tablet  2  times daily        08/27/21 0840    ibuprofen (ADVIL) 600 MG tablet  Every 6 hours PRN        08/27/21 0840              Franne Forts, DO 08/27/21 [phone]

## 2021-08-27 NOTE — ED Triage Notes (Signed)
Pt c/o left sided upper back pain near his shoulder blade that started at work last night. Denies injury

## 2021-08-27 NOTE — Discharge Instructions (Signed)
Muscle relaxers and Motrin sent to your pharmacy.  At this time I want you to rest and decreased twisting motions at cause reoccurring pain.  Follow-up with your primary care physician in 5 to 10 days if pain does not improve.

## 2021-09-03 ENCOUNTER — Other Ambulatory Visit: Payer: BC Managed Care – PPO

## 2021-11-17 DIAGNOSIS — F1120 Opioid dependence, uncomplicated: Secondary | ICD-10-CM | POA: Diagnosis not present

## 2021-12-24 DIAGNOSIS — G4733 Obstructive sleep apnea (adult) (pediatric): Secondary | ICD-10-CM | POA: Diagnosis not present

## 2022-01-03 ENCOUNTER — Encounter: Payer: Self-pay | Admitting: *Deleted

## 2022-02-01 ENCOUNTER — Encounter: Payer: Self-pay | Admitting: *Deleted

## 2022-02-08 DIAGNOSIS — Z79899 Other long term (current) drug therapy: Secondary | ICD-10-CM | POA: Diagnosis not present

## 2022-02-23 ENCOUNTER — Encounter: Payer: Self-pay | Admitting: Family Medicine

## 2022-02-23 ENCOUNTER — Other Ambulatory Visit (HOSPITAL_COMMUNITY)
Admission: RE | Admit: 2022-02-23 | Discharge: 2022-02-23 | Disposition: A | Discharge status: Home / Self Care | Payer: BC Managed Care – PPO | Source: Ambulatory Visit | Attending: Family Medicine | Admitting: Family Medicine

## 2022-02-23 ENCOUNTER — Ambulatory Visit (INDEPENDENT_AMBULATORY_CARE_PROVIDER_SITE_OTHER): Payer: BC Managed Care – PPO | Admitting: Family Medicine

## 2022-02-23 VITALS — BP 103/65 | HR 64 | Temp 98.2°F | Ht 74.0 in | Wt 216.0 lb

## 2022-02-23 DIAGNOSIS — N529 Male erectile dysfunction, unspecified: Secondary | ICD-10-CM | POA: Diagnosis not present

## 2022-02-23 DIAGNOSIS — F439 Reaction to severe stress, unspecified: Secondary | ICD-10-CM | POA: Diagnosis not present

## 2022-02-23 DIAGNOSIS — R739 Hyperglycemia, unspecified: Secondary | ICD-10-CM

## 2022-02-23 DIAGNOSIS — R7303 Prediabetes: Secondary | ICD-10-CM | POA: Diagnosis not present

## 2022-02-23 DIAGNOSIS — Z113 Encounter for screening for infections with a predominantly sexual mode of transmission: Secondary | ICD-10-CM

## 2022-02-23 DIAGNOSIS — Z23 Encounter for immunization: Secondary | ICD-10-CM

## 2022-02-23 LAB — POCT GLYCOSYLATED HEMOGLOBIN (HGB A1C): Hemoglobin A1C: 5.7 % — AB (ref 4.0–5.6)

## 2022-02-23 MED ORDER — TADALAFIL 20 MG PO TABS: 20.0000 mg | ORAL_TABLET | Freq: Every day | ORAL | 5 refills | Status: DC | PRN

## 2022-02-23 NOTE — Assessment & Plan Note (Signed)
On Cialis however feels like he could have a stronger dose.  We will send in 20 mg tablet.  He will let me know if this does not work well we can consider referral to urology.

## 2022-02-23 NOTE — Progress Notes (Signed)
   Grant Woods is a 51 y.o. male who presents today for an office visit.  Assessment/Plan:  New/Acute Problems: Concern for STD Discussed with patient that bacterial vaginosis is associated with sex but is not an STD and that this does not cause any symptoms in men.  We will check a urine sample today to rule out other possible STDs.  Chronic Problems Addressed Today: Erectile dysfunction On Cialis however feels like he could have a stronger dose.  We will send in 20 mg tablet.  He will let me know if this does not work well we can consider referral to urology.  Prediabetes Last A1c was 6.4 but 5.7 today on recheck.  Congratulated patient.  He will come back soon for physical and we can recheck remainder of labs  Stress He has been under a lot of stress recently.  Currently separated from wife.  We did discuss referral to see a counselor however he deferred for now.  Feels like he is managing reasonably well.     Subjective:  HPI:  See A/p for status of chronic conditions.   Patient is here with concern for infection. States that his wife was diagnosed with a bacterial vaginal infection.  This was treated but then she had a recurrence.  They are not sure what type of bacterial infection it was but thinks it may have been bacterial vaginosis.  He does not have any current symptoms.  No discharge.  No pain.  No dysuria.       Objective:  Physical Exam: BP 103/65   Pulse 64   Temp 98.2 F (36.8 C) (Temporal)   Ht 6\' 2"  (1.88 m)   Wt 216 lb (98 kg)   SpO2 97%   BMI 27.73 kg/m   Gen: No acute distress, resting comfortably Neuro: Grossly normal, moves all extremities Psych: Normal affect and thought content      Aurel Nguyen M. , MD 02/23/2022 10:49 AM

## 2022-02-23 NOTE — Patient Instructions (Signed)
It was very nice to see you today!  We will send in a higher strength of Cialis.  We will check urine today to make sure that you do not have any signs of bacterial infection  Please come back soon for your annual physical.  Come back sooner if needed.  Take care, Dr Jimmey Ralph  PLEASE NOTE:  If you had any lab tests please let us know if you have not heard back within a few days. You may see your results on mychart before we have a chance to review them but we will give you a call once they are reviewed by [place]. If we ordered any referrals today, please let us know if you have not heard from their office within the next week.   Please try these tips to maintain a healthy lifestyle:  Eat at least 3 REAL meals and 1-2 snacks per day.  Aim for no more than 5 hours between eating.  If you eat breakfast, please do so within one hour of getting up.   Each meal should contain half fruits/vegetables, one quarter protein, and one quarter carbs (no bigger than a computer mouse)  Cut down on sweet beverages. This includes juice, soda, and sweet tea.   Drink at least 1 glass of water with each meal and aim for at least 8 glasses per day  Exercise at least 150 minutes every week.

## 2022-02-23 NOTE — Assessment & Plan Note (Signed)
He has been under a lot of stress recently.  Currently separated from wife.  We did discuss referral to see a counselor however he deferred for now.  Feels like he is managing reasonably well.

## 2022-02-23 NOTE — Assessment & Plan Note (Signed)
Last A1c was 6.4 but 5.7 today on recheck.  Congratulated patient.  He will come back soon for physical and we can recheck remainder of labs

## 2022-02-24 LAB — URINE CYTOLOGY ANCILLARY ONLY
Chlamydia: NEGATIVE
Comment: NEGATIVE
Comment: NEGATIVE
Comment: NORMAL
Neisseria Gonorrhea: NEGATIVE
Trichomonas: NEGATIVE

## 2022-02-25 LAB — URINE CULTURE
MICRO NUMBER:: 14193085
Result:: NO GROWTH
SPECIMEN QUALITY:: ADEQUATE

## 2022-02-25 NOTE — Progress Notes (Signed)
Please inform patient of the following:  Urine sample is NORMAL. No signs of any bacterial infection.  Grant Woods. Jimmey Ralph, MD 02/25/2022 7:41 AM

## 2022-06-28 DIAGNOSIS — G4733 Obstructive sleep apnea (adult) (pediatric): Secondary | ICD-10-CM | POA: Diagnosis not present

## 2022-07-19 DIAGNOSIS — F1120 Opioid dependence, uncomplicated: Secondary | ICD-10-CM | POA: Diagnosis not present

## 2022-07-19 DIAGNOSIS — F1110 Opioid abuse, uncomplicated: Secondary | ICD-10-CM | POA: Diagnosis not present

## 2022-09-13 DIAGNOSIS — F1110 Opioid abuse, uncomplicated: Secondary | ICD-10-CM | POA: Diagnosis not present

## 2022-09-13 DIAGNOSIS — F1120 Opioid dependence, uncomplicated: Secondary | ICD-10-CM | POA: Diagnosis not present

## 2022-10-18 DIAGNOSIS — G4733 Obstructive sleep apnea (adult) (pediatric): Secondary | ICD-10-CM | POA: Diagnosis not present

## 2022-11-08 DIAGNOSIS — F1110 Opioid abuse, uncomplicated: Secondary | ICD-10-CM | POA: Diagnosis not present

## 2022-11-08 DIAGNOSIS — F1120 Opioid dependence, uncomplicated: Secondary | ICD-10-CM | POA: Diagnosis not present

## 2022-12-09 DIAGNOSIS — G4733 Obstructive sleep apnea (adult) (pediatric): Secondary | ICD-10-CM | POA: Diagnosis not present

## 2022-12-26 DIAGNOSIS — G4733 Obstructive sleep apnea (adult) (pediatric): Secondary | ICD-10-CM | POA: Diagnosis not present

## 2023-01-03 DIAGNOSIS — F1110 Opioid abuse, uncomplicated: Secondary | ICD-10-CM | POA: Diagnosis not present

## 2023-01-03 DIAGNOSIS — F1120 Opioid dependence, uncomplicated: Secondary | ICD-10-CM | POA: Diagnosis not present

## 2023-01-09 DIAGNOSIS — G4733 Obstructive sleep apnea (adult) (pediatric): Secondary | ICD-10-CM | POA: Diagnosis not present

## 2023-03-01 DIAGNOSIS — F1120 Opioid dependence, uncomplicated: Secondary | ICD-10-CM | POA: Diagnosis not present

## 2023-03-01 DIAGNOSIS — F1111 Opioid abuse, in remission: Secondary | ICD-10-CM | POA: Diagnosis not present

## 2023-03-31 ENCOUNTER — Other Ambulatory Visit: Payer: Self-pay | Admitting: Family Medicine

## 2023-04-19 DIAGNOSIS — F1111 Opioid abuse, in remission: Secondary | ICD-10-CM | POA: Diagnosis not present

## 2023-04-19 DIAGNOSIS — F1120 Opioid dependence, uncomplicated: Secondary | ICD-10-CM | POA: Diagnosis not present

## 2023-06-20 DIAGNOSIS — G4733 Obstructive sleep apnea (adult) (pediatric): Secondary | ICD-10-CM | POA: Diagnosis not present

## 2023-06-20 DIAGNOSIS — F1111 Opioid abuse, in remission: Secondary | ICD-10-CM | POA: Diagnosis not present

## 2023-06-20 DIAGNOSIS — F1120 Opioid dependence, uncomplicated: Secondary | ICD-10-CM | POA: Diagnosis not present

## 2023-08-01 ENCOUNTER — Encounter: Payer: Self-pay | Admitting: *Deleted

## 2023-08-15 DIAGNOSIS — F1120 Opioid dependence, uncomplicated: Secondary | ICD-10-CM | POA: Diagnosis not present

## 2023-08-15 DIAGNOSIS — F1111 Opioid abuse, in remission: Secondary | ICD-10-CM | POA: Diagnosis not present

## 2023-09-25 ENCOUNTER — Other Ambulatory Visit: Payer: Self-pay

## 2023-09-25 ENCOUNTER — Encounter: Payer: Self-pay | Admitting: Family Medicine

## 2023-09-25 ENCOUNTER — Ambulatory Visit (INDEPENDENT_AMBULATORY_CARE_PROVIDER_SITE_OTHER): Admitting: Family Medicine

## 2023-09-25 VITALS — BP 109/72 | HR 69 | Temp 96.8°F | Ht 74.0 in | Wt 220.0 lb

## 2023-09-25 DIAGNOSIS — Z131 Encounter for screening for diabetes mellitus: Secondary | ICD-10-CM

## 2023-09-25 DIAGNOSIS — F17210 Nicotine dependence, cigarettes, uncomplicated: Secondary | ICD-10-CM

## 2023-09-25 DIAGNOSIS — Z13220 Encounter for screening for lipoid disorders: Secondary | ICD-10-CM | POA: Diagnosis not present

## 2023-09-25 DIAGNOSIS — Z87891 Personal history of nicotine dependence: Secondary | ICD-10-CM

## 2023-09-25 DIAGNOSIS — Z125 Encounter for screening for malignant neoplasm of prostate: Secondary | ICD-10-CM

## 2023-09-25 DIAGNOSIS — Z1159 Encounter for screening for other viral diseases: Secondary | ICD-10-CM | POA: Diagnosis not present

## 2023-09-25 DIAGNOSIS — N529 Male erectile dysfunction, unspecified: Secondary | ICD-10-CM | POA: Diagnosis not present

## 2023-09-25 DIAGNOSIS — R7303 Prediabetes: Secondary | ICD-10-CM

## 2023-09-25 DIAGNOSIS — Z0001 Encounter for general adult medical examination with abnormal findings: Secondary | ICD-10-CM

## 2023-09-25 DIAGNOSIS — F17200 Nicotine dependence, unspecified, uncomplicated: Secondary | ICD-10-CM | POA: Diagnosis not present

## 2023-09-25 DIAGNOSIS — Z1211 Encounter for screening for malignant neoplasm of colon: Secondary | ICD-10-CM

## 2023-09-25 DIAGNOSIS — Z122 Encounter for screening for malignant neoplasm of respiratory organs: Secondary | ICD-10-CM

## 2023-09-25 LAB — COMPREHENSIVE METABOLIC PANEL WITH GFR
ALT: 14 U/L (ref 0–53)
AST: 17 U/L (ref 0–37)
Albumin: 4.4 g/dL (ref 3.5–5.2)
Alkaline Phosphatase: 43 U/L (ref 39–117)
BUN: 16 mg/dL (ref 6–23)
CO2: 26 meq/L (ref 19–32)
Calcium: 9.4 mg/dL (ref 8.4–10.5)
Chloride: 106 meq/L (ref 96–112)
Creatinine, Ser: 0.73 mg/dL (ref 0.40–1.50)
GFR: 103.9 mL/min (ref >60.00)
Glucose, Bld: 97 mg/dL (ref 70–99)
Potassium: 4.3 meq/L (ref 3.5–5.1)
Sodium: 139 meq/L (ref 135–145)
Total Bilirubin: 0.6 mg/dL (ref 0.2–1.2)
Total Protein: 6.8 g/dL (ref 6.0–8.3)

## 2023-09-25 LAB — CBC
HCT: 38.9 % — ABNORMAL LOW (ref 39.0–52.0)
Hemoglobin: 13.1 g/dL (ref 13.0–17.0)
MCHC: 33.6 g/dL (ref 30.0–36.0)
MCV: 87.7 fl (ref 78.0–100.0)
Platelets: 250 10*3/uL (ref 150.0–400.0)
RBC: 4.43 Mil/uL (ref 4.22–5.81)
RDW: 14.2 % (ref 11.5–15.5)
WBC: 7.9 10*3/uL (ref 4.0–10.5)

## 2023-09-25 LAB — HEMOGLOBIN A1C: Hgb A1c MFr Bld: 6.2 % (ref 4.6–6.5)

## 2023-09-25 LAB — LIPID PANEL
Cholesterol: 154 mg/dL (ref 0–200)
HDL: 48.7 mg/dL (ref >39.00)
LDL Cholesterol: 98 mg/dL (ref 0–99)
NonHDL: 105.7
Total CHOL/HDL Ratio: 3
Triglycerides: 39 mg/dL (ref 0.0–149.0)
VLDL: 7.8 mg/dL (ref 0.0–40.0)

## 2023-09-25 LAB — PSA: PSA: 0.6 ng/mL (ref 0.10–4.00)

## 2023-09-25 LAB — TSH: TSH: 0.76 u[IU]/mL (ref 0.35–5.50)

## 2023-09-25 MED ORDER — TADALAFIL 20 MG PO TABS: 20.0000 mg | ORAL_TABLET | Freq: Every day | ORAL | 5 refills | Status: AC | PRN

## 2023-09-25 NOTE — Assessment & Plan Note (Signed)
 Check A1c.  Discussed lifestyle modifications.

## 2023-09-25 NOTE — Patient Instructions (Signed)
 It was very nice to see you today!  We will check blood work.  Please continue to work on diet and exercise.   I will refer you for colonoscopy and also for lung cancer screening.  I will see you back in 1 year for your next physical.  Come back sooner if needed.  Return in about 1 year (around 09/24/2024) for Annual Physical.   Take care, Dr Daneil Dunker  PLEASE NOTE:  If you had any lab tests, please let us  know if you have not heard back within a few days. You may see your results on mychart before we have a chance to review them but we will give you a call once they are reviewed by us .   If we ordered any referrals today, please let us  know if you have not heard from their office within the next week.   If you had any urgent prescriptions sent in today, please check with the pharmacy within an hour of our visit to make sure the prescription was transmitted appropriately.   Please try these tips to maintain a healthy lifestyle:  Eat at least 3 REAL meals and 1-2 snacks per day.  Aim for no more than 5 hours between eating.  If you eat breakfast, please do so within one hour of getting up.   Each meal should contain half fruits/vegetables, one quarter protein, and one quarter carbs (no bigger than a computer mouse)  Cut down on sweet beverages. This includes juice, soda, and sweet tea.   Drink at least 1 glass of water with each meal and aim for at least 8 glasses per day  Exercise at least 150 minutes every week.    Preventive Care 7-84 Years Old, Male Preventive care refers to lifestyle choices and visits with your health care provider that can promote health and wellness. Preventive care visits are also called wellness exams. What can I expect for my preventive care visit? Counseling During your preventive care visit, your health care provider may ask about your: Medical history, including: Past medical problems. Family medical history. Current health, including: Emotional  well-being. Home life and relationship well-being. Sexual activity. Lifestyle, including: Alcohol, nicotine  or tobacco, and drug use. Access to firearms. Diet, exercise, and sleep habits. Safety issues such as seatbelt and bike helmet use. Sunscreen use. Work and work Astronomer. Physical exam Your health care provider will check your: Height and weight. These may be used to calculate your BMI (body mass index). BMI is a measurement that tells if you are at a healthy weight. Waist circumference. This measures the distance around your waistline. This measurement also tells if you are at a healthy weight and may help predict your risk of certain diseases, such as type 2 diabetes and high blood pressure. Heart rate and blood pressure. Body temperature. Skin for abnormal spots. What immunizations do I need?  Vaccines are usually given at various ages, according to a schedule. Your health care provider will recommend vaccines for you based on your age, medical history, and lifestyle or other factors, such as travel or where you work. What tests do I need? Screening Your health care provider may recommend screening tests for certain conditions. This may include: Lipid and cholesterol levels. Diabetes screening. This is done by checking your blood sugar (glucose) after you have not eaten for a while (fasting). Hepatitis B test. Hepatitis C test. HIV (human immunodeficiency virus) test. STI (sexually transmitted infection) testing, if you are at risk. Lung cancer screening. Prostate  cancer screening. Colorectal cancer screening. Talk with your health care provider about your test results, treatment options, and if necessary, the need for more tests. Follow these instructions at home: Eating and drinking  Eat a diet that includes fresh fruits and vegetables, whole grains, lean protein, and low-fat dairy products. Take vitamin and mineral supplements as recommended by your health care  provider. Do not drink alcohol if your health care provider tells you not to drink. If you drink alcohol: Limit how much you have to 0-2 drinks a day. Know how much alcohol is in your drink. In the [place] one drink equals one 12 oz bottle of beer (355 mL), one 5 oz glass of wine (148 mL), or one 1 oz glass of hard liquor (44 mL). Lifestyle Brush your teeth every morning and night with fluoride toothpaste. Floss one time each day. Exercise for at least 30 minutes 5 or more days each week. Do not use any products that contain nicotine  or tobacco. These products include cigarettes, chewing tobacco, and vaping devices, such as e-cigarettes. If you need help quitting, ask your health care provider. Do not use drugs. If you are sexually active, practice safe sex. Use a condom or other form of protection to prevent STIs. Take aspirin only as told by your health care provider. Make sure that you understand how much to take and what form to take. Work with your health care provider to find out whether it is safe and beneficial for you to take aspirin daily. Find healthy ways to manage stress, such as: Meditation, yoga, or listening to music. Journaling. Talking to a trusted person. Spending time with friends and family. Minimize exposure to UV radiation to reduce your risk of skin cancer. Safety Always wear your seat belt while driving or riding in a vehicle. Do not drive: If you have been drinking alcohol. Do not ride with someone who has been drinking. When you are tired or distracted. While texting. If you have been using any mind-altering substances or drugs. Wear a helmet and other protective equipment during sports activities. If you have firearms in your house, make sure you follow all gun safety procedures. What's next? Go to your health care provider once a year for an annual wellness visit. Ask your health care provider how often you should have your eyes and teeth checked. Stay up to  date on all vaccines. This information is not intended to replace advice given to you by your health care provider. Make sure you discuss any questions you have with your health care provider. Document Revised: 09/23/2020 Document Reviewed: 09/23/2020 Elsevier Patient Education  2024 [employer].

## 2023-09-25 NOTE — Assessment & Plan Note (Signed)
 Patient was asked about his tobacco use today and was strongly advised to quit. Patient is currently contemplative.  Smoking about a pack per day.. We reviewed treatment options to assist him quit smoking including NRT, Chantix, and Bupropion.  He would like to try to wean down on his own without any further medications.  Follow up at next office visit.   Total time spent counseling approximately 3 minutes.   Will also refer for lung cancer screening today.

## 2023-09-25 NOTE — Progress Notes (Signed)
 Chief Complaint:  Grant Woods is a 53 y.o. male who presents today for his annual comprehensive physical exam.    Assessment/Plan:  Chronic Problems Addressed Today: Prediabetes Check A1c.  Discussed lifestyle modifications.  Erectile dysfunction Stable on tadalafil  20 mg as needed.  Tolerating well.  No significant side effects.  Refill today.  Nicotine  dependence with current use Patient was asked about his tobacco use today and was strongly advised to quit. Patient is currently contemplative.  Smoking about a pack per day.. We reviewed treatment options to assist him quit smoking including NRT, Chantix, and Bupropion.  He would like to try to wean down on his own without any further medications.  Follow up at next office visit.   Total time spent counseling approximately 3 minutes.   Will also refer for lung cancer screening today.  Preventative Healthcare: Check labs.  Will refer for colonoscopy and lung cancer screening.  Patient Counseling(The following topics were reviewed and/or handout was given):  -Nutrition: Stressed importance of moderation in sodium/caffeine intake, saturated fat and cholesterol, caloric balance, sufficient intake of fresh fruits, vegetables, and fiber.  -Stressed the importance of regular exercise.   -Substance Abuse: Discussed cessation/primary prevention of tobacco, alcohol, or other drug use; driving or other dangerous activities under the influence; availability of treatment for abuse.   -Injury prevention: Discussed safety belts, safety helmets, smoke detector, smoking near bedding or upholstery.   -Sexuality: Discussed sexually transmitted diseases, partner selection, use of condoms, avoidance of unintended pregnancy and contraceptive alternatives.   -Dental health: Discussed importance of regular tooth brushing, flossing, and dental visits.  -Health maintenance and immunizations reviewed. Please refer to Health maintenance section.  Return  to care in 1 year for next preventative visit.     Subjective:  HPI:  He has no acute complaints today. See Assessment / plan for status of chronic conditions.   Lifestyle Diet: None specific.  Exercise: Going to the gym a few times per week.      09/25/2023    7:32 AM  Depression screen PHQ 2/9  Decreased Interest 0  Down, Depressed, Hopeless 0  PHQ - 2 Score 0    Health Maintenance Due  Topic Date Due   Hepatitis C Screening  Never done   Colonoscopy  Never done   Lung Cancer Screening  08/17/2021     ROS: Per HPI, otherwise a complete review of systems was negative.   PMH:  The following were reviewed and entered/updated in epic: Past Medical History:  Diagnosis Date   Allergy    Back pain    Depression    GERD (gastroesophageal reflux disease)    Heroin use disorder, moderate, in sustained remission (HCC)    MRSA (methicillin resistant Staphylococcus aureus)    Opioid use disorder, moderate, on maintenance therapy [facility])    Patient Active Problem List   Diagnosis Date Noted   Prediabetes 02/23/2022   Stress 02/23/2022   Pulmonary nodule seen on imaging study 09/17/2020   Erectile dysfunction 10/29/2019   Elevated bilirubin    Acid reflux 06/26/2016   Primary osteoarthritis of right knee 06/09/2016   Achilles tendinitis of both lower extremities 06/09/2016   Nicotine  dependence with current use 06/09/2016   Severe sleep apnea 06/09/2016   Heroin use disorder, moderate, in sustained remission (HCC)    Opioid use disorder, moderate, on maintenance therapy [facility])    Past Surgical History:  Procedure Laterality Date   CHOLECYSTECTOMY N/A 06/28/2016   Procedure: LAPAROSCOPIC CHOLECYSTECTOMY  WITH INTRAOPERATIVE CHOLANGIOGRAM;  Surgeon: Derral Flick, MD;  Location: [facility] OR;  Service: General;  Laterality: N/A;   KNEE ARTHROSCOPY Right     Family History  Problem Relation Age of Onset   Diabetes type II Mother    Stroke Mother    Hypertension Mother     Diabetes type II Father     Medications- reviewed and updated Current Outpatient Medications  Medication Sig Dispense Refill   Buprenorphine  HCl-Naloxone  HCl 5.7-1.4 MG SUBL Place 1 tablet under the tongue 2 (two) times daily. 6 AM and 4 PM     ibuprofen  (ADVIL ) 600 MG tablet Take 1 tablet (600 mg total) by mouth every 6 (six) hours as needed. 30 tablet 0   Multiple Vitamin (MULTIVITAMIN WITH MINERALS) TABS tablet Take 1 tablet by mouth daily.     POTASSIUM GLUCONATE PO Take 1 tablet by mouth daily.     tadalafil  (CIALIS ) 20 MG tablet Take 1 tablet (20 mg total) by mouth daily as needed for erectile dysfunction. 30 tablet 5   No current facility-administered medications for this visit.    Allergies-reviewed and updated Allergies  Allergen Reactions   Egg-Derived Products Anaphylaxis and Swelling   Penicillins Hives    Has patient had a PCN reaction causing immediate rash, facial/tongue/throat swelling, SOB or lightheadedness with hypotension: unknown Has patient had a PCN reaction causing severe rash involving mucus membranes or skin necrosis: unknown Has patient had a PCN reaction that required hospitalization unknown Has patient had a PCN reaction occurring within the last 10 years: no If all of the above answers are NO, then may proceed with Cephalosporin use.  Childhood allergy   Dye Fdc Red [Red Dye #40 (Allura Red)]     Social History   Socioeconomic History   Marital status: Single    Spouse name: Not on file   Number of children: Not on file   Years of education: Not on file   Highest education level: Not on file  Occupational History   Occupation: Shipping    Employer: [employer], [employer]  Tobacco Use   Smoking status: Every Day    Current packs/day: 1.00    Average packs/day: 1 pack/day for 34.5 years (34.5 ttl pk-yrs)    Types: Cigarettes    Start date: 1991   Smokeless tobacco: Never   Tobacco comments:    currently smoking 1ppd as of 08/14/20  Vaping  Use   Vaping status: Never Used  Substance and Sexual Activity   Alcohol use: No   Drug use: No    Comment: in remission from heroin    Sexual activity: Not on file  Other Topics Concern   Not on file  Social History Narrative   Not on file   Social Drivers of Health   Financial Resource Strain: Not on file  Food Insecurity: Not on file  Transportation Needs: Not on file  Physical Activity: Not on file  Stress: Not on file  Social Connections: Not on file        Objective:  Physical Exam: BP 109/72   Pulse 69   Temp (!) 96.8 F (36 C) (Temporal)   Ht 6' 2 (1.88 m)   Wt 220 lb (99.8 kg)   SpO2 97%   BMI 28.25 kg/m   Body mass index is 28.25 kg/m. Wt Readings from Last 3 Encounters:  09/25/23 220 lb (99.8 kg)  02/23/22 216 lb (98 kg)  08/27/21 233 lb 11 oz (106 kg)   Gen:  NAD, resting comfortably HEENT: TMs normal bilaterally. OP clear. No thyromegaly noted.  CV: RRR with no murmurs appreciated Pulm: NWOB, CTAB with no crackles, wheezes, or rhonchi GI: Normal bowel sounds present. Soft, Nontender, Nondistended. MSK: no edema, cyanosis, or clubbing noted Skin: warm, dry Neuro: CN2-12 grossly intact. Strength 5/5 in upper and lower extremities. Reflexes symmetric and intact bilaterally.  Psych: Normal affect and thought content     Taitum Alms M. Daneil Dunker, MD 09/25/2023 7:50 AM

## 2023-09-25 NOTE — Assessment & Plan Note (Signed)
 Stable on tadalafil  20 mg as needed.  Tolerating well.  No significant side effects.  Refill today.

## 2023-09-26 ENCOUNTER — Ambulatory Visit: Payer: Self-pay | Admitting: Family Medicine

## 2023-09-26 NOTE — Progress Notes (Signed)
 His hepatitis C test was positive.  This indicates that he has been exposed to the virus at some point in the past.  The lab should be running another test to see if he has an active infection and we will contact him once we get the results back on this.   Team - can we check with the lab to see when this will be back?  His blood sugar is a little elevated but not in the range where we need to start meds.  He should continue to work on diet and exercise and we can recheck in a year.  All of his other labs are at goal.

## 2023-09-28 LAB — HEPATITIS C ANTIBODY: Hepatitis C Ab: REACTIVE — AB

## 2023-09-28 LAB — HEPATITIS C RNA QUANTITATIVE
HCV Quantitative Log: <1.18 {Log_IU}/mL
HCV RNA, PCR, QN: <15 [IU]/mL

## 2023-09-28 NOTE — Progress Notes (Signed)
 Great news!  His hepatitis C quantitative test was negative.  This means that he does not have hepatitis C.  The antibody test was positive which means that he was exposed to this at some point in the past however his body cleared it.  Does not need any other testing or treatment at this point.

## 2023-10-09 ENCOUNTER — Encounter: Payer: Self-pay | Admitting: *Deleted

## 2023-10-10 ENCOUNTER — Inpatient Hospital Stay: Admission: RE | Admit: 2023-10-10 | Source: Ambulatory Visit

## 2023-10-10 DIAGNOSIS — F1120 Opioid dependence, uncomplicated: Secondary | ICD-10-CM | POA: Diagnosis not present

## 2023-10-10 DIAGNOSIS — F1111 Opioid abuse, in remission: Secondary | ICD-10-CM | POA: Diagnosis not present

## 2023-10-31 ENCOUNTER — Encounter: Admitting: Family Medicine

## 2023-11-28 DIAGNOSIS — F1111 Opioid abuse, in remission: Secondary | ICD-10-CM | POA: Diagnosis not present

## 2023-11-28 DIAGNOSIS — F1120 Opioid dependence, uncomplicated: Secondary | ICD-10-CM | POA: Diagnosis not present

## 2023-12-05 ENCOUNTER — Encounter: Payer: Self-pay | Admitting: Family Medicine

## 2023-12-26 DIAGNOSIS — F1120 Opioid dependence, uncomplicated: Secondary | ICD-10-CM | POA: Diagnosis not present

## 2023-12-26 DIAGNOSIS — F1111 Opioid abuse, in remission: Secondary | ICD-10-CM | POA: Diagnosis not present

## 2024-01-05 DIAGNOSIS — G4733 Obstructive sleep apnea (adult) (pediatric): Secondary | ICD-10-CM | POA: Diagnosis not present

## 2024-03-22 DIAGNOSIS — F1120 Opioid dependence, uncomplicated: Secondary | ICD-10-CM | POA: Diagnosis not present

## 2024-04-17 ENCOUNTER — Emergency Department (HOSPITAL_BASED_OUTPATIENT_CLINIC_OR_DEPARTMENT_OTHER)
Admission: EM | Admit: 2024-04-17 | Discharge: 2024-04-17 | Disposition: A | Discharge status: Home / Self Care | Attending: Emergency Medicine | Admitting: Emergency Medicine

## 2024-04-17 ENCOUNTER — Emergency Department (HOSPITAL_BASED_OUTPATIENT_CLINIC_OR_DEPARTMENT_OTHER)

## 2024-04-17 ENCOUNTER — Encounter (HOSPITAL_BASED_OUTPATIENT_CLINIC_OR_DEPARTMENT_OTHER): Payer: Self-pay | Admitting: Emergency Medicine

## 2024-04-17 ENCOUNTER — Other Ambulatory Visit: Payer: Self-pay

## 2024-04-17 DIAGNOSIS — M20011 Mallet finger of right finger(s): Secondary | ICD-10-CM

## 2024-04-17 DIAGNOSIS — W010XXA Fall on same level from slipping, tripping and stumbling without subsequent striking against object, initial encounter: Secondary | ICD-10-CM | POA: Diagnosis not present

## 2024-04-17 DIAGNOSIS — S6991XA Unspecified injury of right wrist, hand and finger(s), initial encounter: Secondary | ICD-10-CM | POA: Insufficient documentation

## 2024-04-17 NOTE — Discharge Instructions (Addendum)
 You have been placed in a finger splint and it is recommend that you follow-up outpatient for repeat assessment in 1 week's time with hand surgery.  You could have sustained an extensor tendon injury in the setting of your fall.  Your x-ray imaging was negative for acute fracture or dislocation.  Recommend rest, ice, elevation of the extremity and NSAIDs for pain control.

## 2024-04-17 NOTE — ED Provider Notes (Signed)
 " [place] EMERGENCY DEPARTMENT AT [facility] Provider Note   CSN: [id number] Arrival date & time: 04/17/24  9042     Patient presents with: Hand Injury   Grant Woods is a 54 y.o. male.    Hand Injury    54 year old male presenting to the emergency department with right hand injury after a slip and fall.  The patient states that he tripped and fell landing onto his outstretched arm.  He jammed his right fifth finger and he is not sure if it hyperextended or hyperflexed.  He has had significant swelling and pain along the right hand and was not sure if it was broken and presents to the emergency department for further evaluation.  Denies head trauma or loss of consciousness.  He arrives GCS 15, ABC intact.  Prior to Admission medications  Medication Sig Start Date End Date Taking? Authorizing Provider  Buprenorphine  HCl-Naloxone  HCl 5.7-1.4 MG SUBL Place 1 tablet under the tongue 2 (two) times daily. 6 AM and 4 PM    [provider]  ibuprofen  (ADVIL ) 600 MG tablet Take 1 tablet (600 mg total) by mouth every 6 (six) hours as needed. 08/27/21   Gray, Alicia P, DO  Multiple Vitamin (MULTIVITAMIN WITH MINERALS) TABS tablet Take 1 tablet by mouth daily.    [provider]  POTASSIUM GLUCONATE PO Take 1 tablet by mouth daily.    [provider]  tadalafil  (CIALIS ) 20 MG tablet Take 1 tablet (20 mg total) by mouth daily as needed for erectile dysfunction. 09/25/23   Kennyth Worth HERO, MD    Allergies: Egg protein-containing drug products, Penicillins, and Dye fdc red [red dye #40 (allura red)]    Review of Systems  All other systems reviewed and are negative.   Updated Vital Signs BP (!) 131/90 (BP Location: Left Arm)   Pulse 72   Temp 97.9 F (36.6 C) (Oral)   Resp 16   SpO2 96%   Physical Exam Vitals and nursing note reviewed.  Constitutional:      General: He is not in acute distress. HENT:     Head: Normocephalic and atraumatic.  Eyes:      Conjunctiva/sclera: Conjunctivae normal.     Pupils: Pupils are equal, round, and reactive to light.  Cardiovascular:     Rate and Rhythm: Normal rate and regular rhythm.  Pulmonary:     Effort: Pulmonary effort is normal. No respiratory distress.  Abdominal:     General: There is no distension.     Tenderness: There is no guarding.  Musculoskeletal:        General: No deformity or signs of injury.     Cervical back: Neck supple.     Comments: Right finger held in flexion, tenderness along the dorsal aspect of the finger, able to extend but not fully, neurovascularly intact  Skin:    Findings: No lesion or rash.  Neurological:     General: No focal deficit present.     Mental Status: He is alert. Mental status is at baseline.     (all labs ordered are listed, but only abnormal results are displayed) Labs Reviewed - No data to display  EKG: None  Radiology: DG Hand Complete Right Result Date: 04/17/2024 CLINICAL DATA:  Right hand pain after fall EXAM: RIGHT HAND - COMPLETE 3+ VIEW COMPARISON:  None Available. FINDINGS: There is no evidence of acute fracture or dislocation. There is no evidence of arthropathy or other focal bone abnormality. Soft tissues  are unremarkable. IMPRESSION: Negative. Electronically Signed   By: Lynwood Landy Raddle M.D.   On: 04/17/2024 10:48     Procedures   Medications Ordered in the ED - No data to display                                  Medical Decision Making Amount and/or Complexity of Data Reviewed Radiology: ordered.    54 year old male presenting to the emergency department with right hand injury after a slip and fall.  The patient states that he tripped and fell landing onto his outstretched arm.  He jammed his right fifth finger and he is not sure if it hyperextended or hyperflexed.  He has had significant swelling and pain along the right hand and was not sure if it was broken and presents to the emergency department for further  evaluation.  Denies head trauma or loss of consciousness.  He arrives GCS 15, ABC intact.  On arrival, the patient was vitally stable, right handed presenting with concern for right hand injury after a fall.  X-ray imaging was obtained with no evidence of fracture or dislocation.  Considered extensor tendon injury with associated swelling.  Patient finger was placed in a splint and outpatient referral and follow-up to hand surgery was placed.  Patient advised rest, ice, NSAIDs for pain control and outpatient follow-up.     Final diagnoses:  Injury of finger of right hand, initial encounter    ED Discharge Orders          Ordered    Ambulatory referral to Hand Surgery        04/17/24 1132               Jerrol Lynwood, MD 04/17/24 1133  "

## 2024-04-17 NOTE — ED Triage Notes (Signed)
 Pt here from home with c/ right hand injury after a slip and fall , right hand slightly swollen

## 2024-09-25 ENCOUNTER — Encounter: Admitting: Family Medicine
